# Patient Record
Sex: Male | Born: 1985
Health system: Southern US, Community
[De-identification: ages and names within clinical notes are randomized; demographics above are authoritative.]

## PROBLEM LIST (undated history)

## (undated) DIAGNOSIS — E559 Vitamin D deficiency, unspecified: Secondary | ICD-10-CM

## (undated) DIAGNOSIS — F1010 Alcohol abuse, uncomplicated: Secondary | ICD-10-CM

## (undated) DIAGNOSIS — E785 Hyperlipidemia, unspecified: Secondary | ICD-10-CM

## (undated) HISTORY — PX: APPENDECTOMY: SHX54

## (undated) HISTORY — DX: Vitamin D deficiency, unspecified: E55.9

## (undated) HISTORY — DX: Hyperlipidemia, unspecified: E78.5

## (undated) HISTORY — DX: Alcohol abuse, uncomplicated: F10.10

---

## 2003-04-02 ENCOUNTER — Emergency Department (HOSPITAL_COMMUNITY): Admission: EM | Admit: 2003-04-02 | Discharge: 2003-04-02 | Payer: Self-pay | Admitting: Obstetrics

## 2003-04-02 ENCOUNTER — Encounter: Payer: Self-pay | Admitting: *Deleted

## 2012-09-14 ENCOUNTER — Ambulatory Visit: Payer: BC Managed Care – PPO

## 2012-09-14 ENCOUNTER — Ambulatory Visit (INDEPENDENT_AMBULATORY_CARE_PROVIDER_SITE_OTHER): Payer: BC Managed Care – PPO | Admitting: Family Medicine

## 2012-09-14 VITALS — BP 118/74 | HR 77 | Temp 98.1°F | Resp 18 | Ht 69.0 in | Wt 193.0 lb

## 2012-09-14 DIAGNOSIS — M25469 Effusion, unspecified knee: Secondary | ICD-10-CM

## 2012-09-14 DIAGNOSIS — F1010 Alcohol abuse, uncomplicated: Secondary | ICD-10-CM | POA: Insufficient documentation

## 2012-09-14 DIAGNOSIS — M25569 Pain in unspecified knee: Secondary | ICD-10-CM

## 2012-09-14 DIAGNOSIS — T148XXA Other injury of unspecified body region, initial encounter: Secondary | ICD-10-CM

## 2012-09-14 DIAGNOSIS — M25561 Pain in right knee: Secondary | ICD-10-CM

## 2012-09-14 DIAGNOSIS — F1020 Alcohol dependence, uncomplicated: Secondary | ICD-10-CM | POA: Insufficient documentation

## 2012-09-14 HISTORY — DX: Alcohol abuse, uncomplicated: F10.10

## 2012-09-14 NOTE — Progress Notes (Signed)
 [facility]:  Office Visit  Chief Complaint:  Chief Complaint  Patient presents with  . Knee Injury    right, happened saturday night    HPI: Louis Martinez is a 26 y.o. male who complains of left knee injury with localized knee swelling and  instability  for the last 3 days after wrestling with his friend. His friend swept underneath his left knee with his leg and he fell. The knee went in one direction and his upper body went in another direction. The patient has had a prior stress fracture to that leg. He did not have pain immediately or swelling , he still does not have pain, he just has swelling and a strange sensation in his knee. He denies hearing a pop, click. He is a daily weed user and alcoholic drinker so this may be inhibiting his pain sensation. He is using a knee brace with minimal relief.  Past Medical History  Diagnosis Date  . Alcohol abuse 09/14/12    Patient does not desire to quit at this time   Past Surgical History  Procedure Date  . Appendectomy    History   Social History  . Marital Status: Single    Spouse Name: N/A    Number of Children: N/A  . Years of Education: N/A   Social History Main Topics  . Smoking status: Never Smoker   . Smokeless tobacco: None  . Alcohol Use: Yes  . Drug Use: Yes    Special: Marijuana  . Sexually Active: None   Other Topics Concern  . None   Social History Narrative  . None   Family History  Problem Relation Age of Onset  . Diabetes Mother   . Hypertension Mother    No Known Allergies Prior to Admission medications   Not on File     ROS: The patient denies fevers, chills, night sweats, unintentional weight loss, chest pain, palpitations, wheezing, dyspnea on exertion, nausea, vomiting, abdominal pain, dysuria, hematuria, melena, numbness,  or tingling.   All other systems have been reviewed and were otherwise negative with the exception of those mentioned in the HPI and as above.     PHYSICAL EXAM: Filed Vitals:   09/14/12 1338  BP: 118/74  Pulse: 77  Temp: 98.1 F (36.7 C)  Resp: 18   Filed Vitals:   09/14/12 1338  Height: 5\' 9"  (1.753 m)  Weight: 193 lb (87.544 kg)   Body mass index is 28.50 kg/(m^2).  General: Alert, no acute distress HEENT:  Normocephalic, atraumatic, oropharynx patent.  Cardiovascular:  Regular rate and rhythm, no rubs murmurs or gallops.  No Carotid bruits, radial pulse intact. No pedal edema.  Respiratory: Clear to auscultation bilaterally.  No wheezes, rales, or rhonchi.  No cyanosis, no use of accessory musculature GI: No organomegaly, abdomen is soft and non-tender, positive bowel sounds.  No masses. Skin: No rashes. Neurologic: Facial musculature symmetric. Psychiatric: Patient is appropriate throughout our interaction. Lymphatic: No cervical lymphadenopathy Musculoskeletal: Gait not intact Hips-nl Right quad-4/4 strength Right knee- + swelling lateral , neg crepitus, + medial jt linetenderness, stable to varus/valgus stress, ? + Lachman's test.    LABS: No results found for this or any previous visit.   EKG/XRAY:   Primary read interpreted by Dr. Conley Rolls at [facility]. Right knee-no fx/dislocation   ASSESSMENT/PLAN: Encounter Diagnoses  Name Primary?  . Knee pain, right Yes  . Knee swelling   . Sprain and strain    ? Simple  Sprain and strain vs ? ACL tear/meniscus injury Will immobilize knee Rx NSAIDs otc RICE Stay off work for one week Patient will call in 1 week to see if need referral to ortho    ,  PHUONG, DO 09/14/2012 2:45 PM

## 2012-10-02 ENCOUNTER — Telehealth: Payer: Self-pay

## 2012-10-02 NOTE — Telephone Encounter (Signed)
Asked XRay Dept to make a copy of xray.

## 2012-10-02 NOTE — Telephone Encounter (Signed)
Patient would like copy of x-rays to pick up.  Best [phone]

## 2012-10-02 NOTE — Telephone Encounter (Signed)
Notified pt that xray is ready for p/up

## 2014-01-23 DIAGNOSIS — E559 Vitamin D deficiency, unspecified: Secondary | ICD-10-CM | POA: Insufficient documentation

## 2014-01-24 ENCOUNTER — Ambulatory Visit (INDEPENDENT_AMBULATORY_CARE_PROVIDER_SITE_OTHER): Payer: BC Managed Care – PPO | Admitting: Emergency Medicine

## 2014-01-24 ENCOUNTER — Encounter: Payer: Self-pay | Admitting: Emergency Medicine

## 2014-01-24 VITALS — BP 122/80 | HR 68 | Temp 98.0°F | Resp 18 | Ht 69.0 in | Wt 202.0 lb

## 2014-01-24 DIAGNOSIS — Z111 Encounter for screening for respiratory tuberculosis: Secondary | ICD-10-CM

## 2014-01-24 DIAGNOSIS — Z0000 Encounter for general adult medical examination without abnormal findings: Secondary | ICD-10-CM

## 2014-01-24 DIAGNOSIS — Z79899 Other long term (current) drug therapy: Secondary | ICD-10-CM

## 2014-01-24 DIAGNOSIS — E559 Vitamin D deficiency, unspecified: Secondary | ICD-10-CM

## 2014-01-24 DIAGNOSIS — E785 Hyperlipidemia, unspecified: Secondary | ICD-10-CM

## 2014-01-24 DIAGNOSIS — E782 Mixed hyperlipidemia: Secondary | ICD-10-CM | POA: Insufficient documentation

## 2014-01-24 LAB — CBC WITH DIFFERENTIAL/PLATELET
Basophils Absolute: 0 10*3/uL (ref 0.0–0.1)
Basophils Relative: 0 % (ref 0–1)
EOS ABS: 0.1 10*3/uL (ref 0.0–0.7)
EOS PCT: 1 % (ref 0–5)
HEMATOCRIT: 46 % (ref 39.0–52.0)
HEMOGLOBIN: 16.4 g/dL (ref 13.0–17.0)
LYMPHS ABS: 1.5 10*3/uL (ref 0.7–4.0)
Lymphocytes Relative: 17 % (ref 12–46)
MCH: 33 pg (ref 26.0–34.0)
MCHC: 35.7 g/dL (ref 30.0–36.0)
MCV: 92.6 fL (ref 78.0–100.0)
MONOS PCT: 8 % (ref 3–12)
Monocytes Absolute: 0.7 10*3/uL (ref 0.1–1.0)
Neutro Abs: 6.4 10*3/uL (ref 1.7–7.7)
Neutrophils Relative %: 74 % (ref 43–77)
Platelets: 245 10*3/uL (ref 150–400)
RBC: 4.97 MIL/uL (ref 4.22–5.81)
RDW: 13.6 % (ref 11.5–15.5)
WBC: 8.7 10*3/uL (ref 4.0–10.5)

## 2014-01-24 LAB — HEMOGLOBIN A1C
Hgb A1c MFr Bld: 4.9 % (ref <5.7)
Mean Plasma Glucose: 94 mg/dL (ref <117)

## 2014-01-24 NOTE — Progress Notes (Signed)
   Subjective:    Patient ID: Louis Martinez, male    DOB: April 06, 1986, 28 y.o.   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 He is not exercising, but keeps busy. He eats descent. He is doing well overall and denies any concerns. LAST LABS BS 102 T 1 A1C 4.9 TG 72 H 49 L108 D 30 STD PANEL NEG 08/2012  He is up to date with immunizations except he did not receive 3rd gardasil but declines. He has updated eye appointment and wears corrective lenses.      Review of Systems  All other systems reviewed and are negative.   BP 122/80  Pulse 68  Temp(Src) 98 F (36.7 C) (Temporal)  Resp 18  Ht 5\' 9"  (1.753 m)  Wt 202 lb (91.627 kg)  BMI 29.82 kg/m2      Objective:   Physical Exam  Nursing note and vitals reviewed. Constitutional: He is oriented to person, place, and time. He appears well-developed and well-nourished.  HENT:  Head: Normocephalic and atraumatic.  Right Ear: External ear normal.  Left Ear: External ear normal.  Nose: Nose normal.  Eyes: Conjunctivae and EOM are normal. Pupils are equal, round, and reactive to light. Right eye exhibits no discharge. Left eye exhibits no discharge. No scleral icterus.  Neck: Normal range of motion. Neck supple. No JVD present. No tracheal deviation present. No thyromegaly present.  Cardiovascular: Normal rate, regular rhythm, normal heart sounds and intact distal pulses.   Pulmonary/Chest: Effort normal and breath sounds normal.  Abdominal: Soft. Bowel sounds are normal. He exhibits no distension and no mass. There is no tenderness. There is no rebound and no guarding.  Genitourinary:  Def til 2016   Musculoskeletal: Normal range of motion. He exhibits no edema and no tenderness.  Lymphadenopathy:    He has no cervical adenopathy.  Neurological: He is alert and oriented to person, place, and time. He has normal reflexes. No cranial nerve deficit. He exhibits normal muscle tone. Coordination normal.  Skin:  Skin is warm and dry. No rash noted. No erythema. No pallor.  Right posterior shoulder 3 mm mild dark brown irreg flat nevi  Psychiatric: He has a normal mood and affect. His behavior is normal. Judgment and thought content normal.     EKG NSCSPT WNL     Assessment & Plan:  1. CPE- Update screening labs/ History/ Immunizations/ Testing as needed. Advised healthy diet, QD exercise, increase H20 and continue RX/ Vitamins AD. 2. Irreg Nevi- monitor for any change, call if occurs for removal 3. Cholesterol- recheck labs, Need to eat healthier and exercise AD.

## 2014-01-24 NOTE — Patient Instructions (Signed)
Fat and Cholesterol Control Diet  Fat and cholesterol levels in your blood and organs are influenced by your diet. High levels of fat and cholesterol may lead to diseases of the heart, small and large blood vessels, gallbladder, liver, and pancreas.  CONTROLLING FAT AND CHOLESTEROL WITH DIET  Although exercise and lifestyle factors are important, your diet is key. That is because certain foods are known to raise cholesterol and others to lower it. The goal is to balance foods for their effect on cholesterol and more importantly, to replace saturated and trans fat with other types of fat, such as monounsaturated fat, polyunsaturated fat, and omega-3 fatty acids.  On average, a person should consume no more than 15 to 17 g of saturated fat daily. Saturated and trans fats are considered "bad" fats, and they will raise LDL cholesterol. Saturated fats are primarily found in animal products such as meats, butter, and cream. However, that does not mean you need to give up all your favorite foods. Today, there are good tasting, low-fat, low-cholesterol substitutes for most of the things you like to eat. Choose low-fat or nonfat alternatives. Choose round or loin cuts of red meat. These types of cuts are lowest in fat and cholesterol. Chicken (without the skin), fish, veal, and ground turkey breast are great choices. Eliminate fatty meats, such as hot dogs and salami. Even shellfish have little or no saturated fat. Have a 3 oz (85 g) portion when you eat lean meat, poultry, or fish.  Trans fats are also called "partially hydrogenated oils." They are oils that have been scientifically manipulated so that they are solid at room temperature resulting in a longer shelf life and improved taste and texture of foods in which they are added. Trans fats are found in stick margarine, some tub margarines, cookies, crackers, and baked goods.   When baking and cooking, oils are a great substitute for butter. The monounsaturated oils are  especially beneficial since it is believed they lower LDL and raise HDL. The oils you should avoid entirely are saturated tropical oils, such as coconut and palm.   Remember to eat a lot from food groups that are naturally free of saturated and trans fat, including fish, fruit, vegetables, beans, grains (barley, rice, couscous, bulgur wheat), and pasta (without cream sauces).   IDENTIFYING FOODS THAT LOWER FAT AND CHOLESTEROL   Soluble fiber may lower your cholesterol. This type of fiber is found in fruits such as apples, vegetables such as broccoli, potatoes, and carrots, legumes such as beans, peas, and lentils, and grains such as barley. Foods fortified with plant sterols (phytosterol) may also lower cholesterol. You should eat at least 2 g per day of these foods for a cholesterol lowering effect.   Read package labels to identify low-saturated fats, trans fat free, and low-fat foods at the supermarket. Select cheeses that have only 2 to 3 g saturated fat per ounce. Use a heart-healthy tub margarine that is free of trans fats or partially hydrogenated oil. When buying baked goods (cookies, crackers), avoid partially hydrogenated oils. Breads and muffins should be made from whole grains (whole-wheat or whole oat flour, instead of "flour" or "enriched flour"). Buy non-creamy canned soups with reduced salt and no added fats.   FOOD PREPARATION TECHNIQUES   Never deep-fry. If you must fry, either stir-fry, which uses very little fat, or use non-stick cooking sprays. When possible, broil, bake, or roast meats, and steam vegetables. Instead of putting butter or margarine on vegetables, use lemon   and herbs, applesauce, and cinnamon (for squash and sweet potatoes). Use nonfat yogurt, salsa, and low-fat dressings for salads.   LOW-SATURATED FAT / LOW-FAT FOOD SUBSTITUTES  Meats / Saturated Fat (g)  · Avoid: Steak, marbled (3 oz/85 g) / 11 g  · Choose: Steak, lean (3 oz/85 g) / 4 g  · Avoid: Hamburger (3 oz/85 g) / 7  g  · Choose: Hamburger, lean (3 oz/85 g) / 5 g  · Avoid: Ham (3 oz/85 g) / 6 g  · Choose: Ham, lean cut (3 oz/85 g) / 2.4 g  · Avoid: Chicken, with skin, dark meat (3 oz/85 g) / 4 g  · Choose: Chicken, skin removed, dark meat (3 oz/85 g) / 2 g  · Avoid: Chicken, with skin, light meat (3 oz/85 g) / 2.5 g  · Choose: Chicken, skin removed, light meat (3 oz/85 g) / 1 g  Dairy / Saturated Fat (g)  · Avoid: Whole milk (1 cup) / 5 g  · Choose: Low-fat milk, 2% (1 cup) / 3 g  · Choose: Low-fat milk, 1% (1 cup) / 1.5 g  · Choose: Skim milk (1 cup) / 0.3 g  · Avoid: Hard cheese (1 oz/28 g) / 6 g  · Choose: Skim milk cheese (1 oz/28 g) / 2 to 3 g  · Avoid: Cottage cheese, 4% fat (1 cup) / 6.5 g  · Choose: Low-fat cottage cheese, 1% fat (1 cup) / 1.5 g  · Avoid: Ice cream (1 cup) / 9 g  · Choose: Sherbet (1 cup) / 2.5 g  · Choose: Nonfat frozen yogurt (1 cup) / 0.3 g  · Choose: Frozen fruit bar / trace  · Avoid: Whipped cream (1 tbs) / 3.5 g  · Choose: Nondairy whipped topping (1 tbs) / 1 g  Condiments / Saturated Fat (g)  · Avoid: Mayonnaise (1 tbs) / 2 g  · Choose: Low-fat mayonnaise (1 tbs) / 1 g  · Avoid: Butter (1 tbs) / 7 g  · Choose: Extra light margarine (1 tbs) / 1 g  · Avoid: Coconut oil (1 tbs) / 11.8 g  · Choose: Olive oil (1 tbs) / 1.8 g  · Choose: Corn oil (1 tbs) / 1.7 g  · Choose: Safflower oil (1 tbs) / 1.2 g  · Choose: Sunflower oil (1 tbs) / 1.4 g  · Choose: Soybean oil (1 tbs) / 2.4 g  · Choose: Canola oil (1 tbs) / 1 g  Document Released: 11/25/2005 Document Revised: 03/22/2013 Document Reviewed: 05/16/2011  ExitCare® Patient Information ©2014 [employer].

## 2014-01-25 LAB — BASIC METABOLIC PANEL WITH GFR
BUN: 9 mg/dL (ref 6–23)
CALCIUM: 9.4 mg/dL (ref 8.4–10.5)
CO2: 27 meq/L (ref 19–32)
CREATININE: 0.77 mg/dL (ref 0.50–1.35)
Chloride: 102 mEq/L (ref 96–112)
GFR, Est African American: >89 mL/min
GFR, Est Non African American: >89 mL/min
GLUCOSE: 95 mg/dL (ref 70–99)
Potassium: 4.1 mEq/L (ref 3.5–5.3)
Sodium: 143 mEq/L (ref 135–145)

## 2014-01-25 LAB — MICROALBUMIN / CREATININE URINE RATIO
Creatinine, Urine: 407 mg/dL
Microalb Creat Ratio: 3.2 mg/g (ref 0.0–30.0)
Microalb, Ur: 1.3 mg/dL (ref 0.00–1.89)

## 2014-01-25 LAB — URINALYSIS, ROUTINE W REFLEX MICROSCOPIC
GLUCOSE, UA: NEGATIVE mg/dL
HGB URINE DIPSTICK: NEGATIVE
KETONES UR: NEGATIVE mg/dL
Leukocytes, UA: NEGATIVE
Nitrite: NEGATIVE
PROTEIN: NEGATIVE mg/dL
Specific Gravity, Urine: 1.03 (ref 1.005–1.030)
Urobilinogen, UA: 0.2 mg/dL (ref 0.0–1.0)
pH: 6 (ref 5.0–8.0)

## 2014-01-25 LAB — LIPID PANEL
Cholesterol: 181 mg/dL (ref 0–200)
HDL: 43 mg/dL (ref >39)
LDL CALC: 108 mg/dL — AB (ref 0–99)
TRIGLYCERIDES: 150 mg/dL — AB (ref <150)
Total CHOL/HDL Ratio: 4.2 Ratio
VLDL: 30 mg/dL (ref 0–40)

## 2014-01-25 LAB — TSH: TSH: 1.092 u[IU]/mL (ref 0.350–4.500)

## 2014-01-25 LAB — HEPATIC FUNCTION PANEL
ALBUMIN: 4.6 g/dL (ref 3.5–5.2)
ALT: 25 U/L (ref 0–53)
AST: 18 U/L (ref 0–37)
Alkaline Phosphatase: 82 U/L (ref 39–117)
BILIRUBIN INDIRECT: 1.1 mg/dL (ref 0.2–1.2)
Bilirubin, Direct: 0.2 mg/dL (ref 0.0–0.3)
TOTAL PROTEIN: 6.8 g/dL (ref 6.0–8.3)
Total Bilirubin: 1.3 mg/dL — ABNORMAL HIGH (ref 0.2–1.2)

## 2014-01-25 LAB — MAGNESIUM: Magnesium: 1.9 mg/dL (ref 1.5–2.5)

## 2014-01-25 LAB — VITAMIN D 25 HYDROXY (VIT D DEFICIENCY, FRACTURES): Vit D, 25-Hydroxy: 21 ng/mL — ABNORMAL LOW (ref 30–89)

## 2014-01-25 LAB — INSULIN, FASTING: Insulin fasting, serum: 12 u[IU]/mL (ref 3–28)

## 2014-01-25 LAB — TESTOSTERONE: TESTOSTERONE: 481 ng/dL (ref 300–890)

## 2014-01-27 LAB — TB SKIN TEST
Induration: 0 mm
TB Skin Test: NEGATIVE

## 2014-02-04 NOTE — Progress Notes (Signed)
LMOM

## 2014-05-10 ENCOUNTER — Ambulatory Visit: Payer: Self-pay | Admitting: Emergency Medicine

## 2015-01-24 ENCOUNTER — Encounter: Payer: Self-pay | Admitting: Emergency Medicine

## 2015-03-08 ENCOUNTER — Ambulatory Visit (INDEPENDENT_AMBULATORY_CARE_PROVIDER_SITE_OTHER): Payer: BLUE CROSS/BLUE SHIELD | Admitting: Emergency Medicine

## 2015-03-08 ENCOUNTER — Encounter: Payer: Self-pay | Admitting: Emergency Medicine

## 2015-03-08 VITALS — BP 106/58 | HR 64 | Temp 98.4°F | Resp 18 | Ht 69.0 in

## 2015-03-08 DIAGNOSIS — R197 Diarrhea, unspecified: Secondary | ICD-10-CM

## 2015-03-08 DIAGNOSIS — R6889 Other general symptoms and signs: Secondary | ICD-10-CM

## 2015-03-08 DIAGNOSIS — E559 Vitamin D deficiency, unspecified: Secondary | ICD-10-CM

## 2015-03-08 DIAGNOSIS — I10 Essential (primary) hypertension: Secondary | ICD-10-CM

## 2015-03-08 DIAGNOSIS — Z1212 Encounter for screening for malignant neoplasm of rectum: Secondary | ICD-10-CM

## 2015-03-08 DIAGNOSIS — Z79899 Other long term (current) drug therapy: Secondary | ICD-10-CM

## 2015-03-08 DIAGNOSIS — Z111 Encounter for screening for respiratory tuberculosis: Secondary | ICD-10-CM

## 2015-03-08 DIAGNOSIS — Z0001 Encounter for general adult medical examination with abnormal findings: Secondary | ICD-10-CM

## 2015-03-08 DIAGNOSIS — Z113 Encounter for screening for infections with a predominantly sexual mode of transmission: Secondary | ICD-10-CM

## 2015-03-08 DIAGNOSIS — F411 Generalized anxiety disorder: Secondary | ICD-10-CM

## 2015-03-08 DIAGNOSIS — Z0000 Encounter for general adult medical examination without abnormal findings: Secondary | ICD-10-CM

## 2015-03-08 LAB — CBC WITH DIFFERENTIAL/PLATELET
Basophils Absolute: 0.1 10*3/uL (ref 0.0–0.1)
Basophils Relative: 1 % (ref 0–1)
EOS PCT: 2 % (ref 0–5)
Eosinophils Absolute: 0.1 10*3/uL (ref 0.0–0.7)
HCT: 42.8 % (ref 39.0–52.0)
Hemoglobin: 14.9 g/dL (ref 13.0–17.0)
LYMPHS PCT: 20 % (ref 12–46)
Lymphs Abs: 1.3 10*3/uL (ref 0.7–4.0)
MCH: 32.9 pg (ref 26.0–34.0)
MCHC: 34.8 g/dL (ref 30.0–36.0)
MCV: 94.5 fL (ref 78.0–100.0)
MONO ABS: 0.7 10*3/uL (ref 0.1–1.0)
MPV: 10.1 fL (ref 8.6–12.4)
Monocytes Relative: 11 % (ref 3–12)
Neutro Abs: 4.2 10*3/uL (ref 1.7–7.7)
Neutrophils Relative %: 66 % (ref 43–77)
PLATELETS: 192 10*3/uL (ref 150–400)
RBC: 4.53 MIL/uL (ref 4.22–5.81)
RDW: 13.4 % (ref 11.5–15.5)
WBC: 6.4 10*3/uL (ref 4.0–10.5)

## 2015-03-08 LAB — HEMOGLOBIN A1C
HEMOGLOBIN A1C: 4.9 % (ref <5.7)
MEAN PLASMA GLUCOSE: 94 mg/dL (ref <117)

## 2015-03-08 NOTE — Progress Notes (Signed)
Subjective:    Patient ID: Louis Martinez, male    DOB: [birth year], 29 y.o.   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 He has mildly elevated cholesterol.  He is not exercising routinely or eating healthy.   He notes increased anxiety over the last several months. He notes sleep is good with set schedule. He notes bowel movements are on/off with diarrhea.  Diarrhea worse with diet of increased meat and when anxiety is increased.  He needs STD screening, denies known exposure or symptoms.  Lab Results      Component                Value               Date                      WBC                      8.7                 01/24/2014                HGB                      16.4                01/24/2014                HCT                      46.0                01/24/2014                PLT                      245                 01/24/2014                GLUCOSE                  95                  01/24/2014                CHOL                     181                 01/24/2014                TRIG                     150*                01/24/2014                HDL                      43                  01/24/2014                LDLCALC  108*                01/24/2014                ALT                      25                  01/24/2014                AST                      18                  01/24/2014                NA                       143                 01/24/2014                K                        4.1                 01/24/2014                CL                       102                 01/24/2014                CREATININE               0.77                01/24/2014                BUN                      9                   01/24/2014                CO2                      27                  01/24/2014                TSH                      1.092               01/24/2014                HGBA1C                   4.9                  01/24/2014                MICROALBUR               1.30  01/24/2014                Medication List    Notice  As of 03/08/2015  9:24 AM   You have not been prescribed any medications.     No Known Allergies  Past Medical History  Diagnosis Date  . Alcohol abuse 09/14/12    Patient does not desire to quit at this time  . Vitamin D deficiency   . Hyperlipidemia    Past Surgical History  Procedure Laterality Date  . Appendectomy      29 year old   History  Substance Use Topics  . Smoking status: Current Some Day Smoker  . Smokeless tobacco: Not on file  . Alcohol Use: 0.0 oz/week    0 Standard drinks or equivalent per week   Family History  Problem Relation Age of Onset  . Diabetes Mother   . Hypertension Mother   . Alcohol abuse Father   . Cancer Maternal Grandmother     BREAST  . Stroke Maternal Grandfather   . Cancer Paternal Grandmother     lung   MAINTENANCE: EYE: contacts 2016/ WNL Dentist:03/07/15 WNL Q 6 months  IMMUNIZATIONS: Tdap:2010 Pneumovax:n/a Zostavax:n/a Influenza: declines  Patient Care Team: Lucky Cowboy, MD as PCP - General (Internal Medicine)  Webb Silversmith, (Dentist) Lorin Picket, (EYE)  Review of Systems  Constitutional: Negative for fatigue.  Respiratory: Negative for shortness of breath.   Cardiovascular: Negative for chest pain.  Gastrointestinal: Positive for diarrhea.  Psychiatric/Behavioral: Negative for suicidal ideas and agitation. The patient is nervous/anxious.   All other systems reviewed and are negative.  BP 106/58 mmHg  Pulse 64  Temp(Src) 98.4 F (36.9 C) (Temporal)  Resp 18  Ht 5\' 9"  (1.753 m)  PF 171 L/min      Objective:   Physical Exam  Constitutional: He is oriented to person, place, and time. He appears well-developed and well-nourished.  HENT:  Head: Normocephalic.  Nose: Nose normal.  Mouth/Throat: Oropharynx is clear and moist.  Eyes: Conjunctivae and EOM are normal. Pupils are equal, round,  and reactive to light. Right eye exhibits no discharge. Left eye exhibits no discharge. No scleral icterus.  Neck: Normal range of motion. Neck supple. No JVD present. No tracheal deviation present. No thyromegaly present.  Cardiovascular: Normal rate, regular rhythm, normal heart sounds and intact distal pulses.   Pulmonary/Chest: Effort normal and breath sounds normal.  Abdominal: Soft. Bowel sounds are normal. He exhibits no distension and no mass. There is no tenderness. There is no rebound and no guarding.  Musculoskeletal: Normal range of motion. He exhibits no edema or tenderness.  Lymphadenopathy:    He has no cervical adenopathy.  Neurological: He is alert and oriented to person, place, and time. He has normal reflexes. No cranial nerve deficit. He exhibits normal muscle tone. Coordination normal.  Skin: Skin is warm and dry. No rash noted. No erythema.  Psychiatric: He has a normal mood and affect. His behavior is normal. Judgment and thought content normal.  Nursing note and vitals reviewed.     EKG NSCSPT WNL     Assessment & Plan:  1. CPE- Update screening labs/ History/ Immunizations/ Testing as needed. Advised healthy diet, QD exercise, increase H20 and continue RX/ Vitamins AD.  2. Anxiety vs mood disorder with + hx of alcohol and marijuana use- Check labs if WNL consider medication. Increase exercise routine, improve sleep hygiene and diet. Sleep hygiene discussed. w/c if SX increase or ER.   3. Diarrhea- ?  Anxiety vs diet related. Check labs/ stool cards. Improve diet and decrease stress. Add probiotic. May need GI referral if no improvement  4. Cholesterol- recheck labs, Need to eat healthier and exercise AD.

## 2015-03-08 NOTE — Patient Instructions (Signed)
Generalized Anxiety Disorder Generalized anxiety disorder (GAD) is a mental disorder. It interferes with life functions, including relationships, work, and school. GAD is different from normal anxiety, which everyone experiences at some point in their lives in response to specific life events and activities. Normal anxiety actually helps us prepare for and get through these life events and activities. Normal anxiety goes away after the event or activity is over.  GAD causes anxiety that is not necessarily related to specific events or activities. It also causes excess anxiety in proportion to specific events or activities. The anxiety associated with GAD is also difficult to control. GAD can vary from mild to severe. People with severe GAD can have intense waves of anxiety with physical symptoms (panic attacks).  SYMPTOMS The anxiety and worry associated with GAD are difficult to control. This anxiety and worry are related to many life events and activities and also occur more days than not for 6 months or longer. People with GAD also have three or more of the following symptoms (one or more in children):  Restlessness.   Fatigue.  Difficulty concentrating.   Irritability.  Muscle tension.  Difficulty sleeping or unsatisfying sleep. DIAGNOSIS GAD is diagnosed through an assessment by your health care provider. Your health care provider will ask you questions aboutyour mood,physical symptoms, and events in your life. Your health care provider may ask you about your medical history and use of alcohol or drugs, including prescription medicines. Your health care provider may also do a physical exam and blood tests. Certain medical conditions and the use of certain substances can cause symptoms similar to those associated with GAD. Your health care provider may refer you to a mental health specialist for further evaluation. TREATMENT The following therapies are usually used to treat GAD:    Medication. Antidepressant medication usually is prescribed for long-term daily control. Antianxiety medicines may be added in severe cases, especially when panic attacks occur.   Talk therapy (psychotherapy). Certain types of talk therapy can be helpful in treating GAD by providing support, education, and guidance. A form of talk therapy called cognitive behavioral therapy can teach you healthy ways to think about and react to daily life events and activities.  Stress managementtechniques. These include yoga, meditation, and exercise and can be very helpful when they are practiced regularly. A mental health specialist can help determine which treatment is best for you. Some people see improvement with one therapy. However, other people require a combination of therapies. Document Released: 03/22/2013 Document Revised: 04/11/2014 Document Reviewed: 03/22/2013 ExitCare Patient Information 2015 [employer]. This information is not intended to replace advice given to you by your health care provider. Make sure you discuss any questions you have with your health care provider.  

## 2015-03-09 LAB — VITAMIN D 25 HYDROXY (VIT D DEFICIENCY, FRACTURES): Vit D, 25-Hydroxy: 20 ng/mL — ABNORMAL LOW (ref 30–100)

## 2015-03-09 LAB — HEPATIC FUNCTION PANEL
ALT: 20 U/L (ref 0–53)
AST: 18 U/L (ref 0–37)
Albumin: 4.3 g/dL (ref 3.5–5.2)
Alkaline Phosphatase: 66 U/L (ref 39–117)
BILIRUBIN TOTAL: 1 mg/dL (ref 0.2–1.2)
Bilirubin, Direct: 0.2 mg/dL (ref 0.0–0.3)
Indirect Bilirubin: 0.8 mg/dL (ref 0.2–1.2)
TOTAL PROTEIN: 6.5 g/dL (ref 6.0–8.3)

## 2015-03-09 LAB — BASIC METABOLIC PANEL WITH GFR
BUN: 9 mg/dL (ref 6–23)
CO2: 24 mEq/L (ref 19–32)
Calcium: 9.1 mg/dL (ref 8.4–10.5)
Chloride: 104 mEq/L (ref 96–112)
Creat: 0.73 mg/dL (ref 0.50–1.35)
GFR, Est African American: >89 mL/min
GFR, Est Non African American: >89 mL/min
Glucose, Bld: 83 mg/dL (ref 70–99)
Potassium: 4.2 mEq/L (ref 3.5–5.3)
Sodium: 142 mEq/L (ref 135–145)

## 2015-03-09 LAB — URINALYSIS, ROUTINE W REFLEX MICROSCOPIC
Bilirubin Urine: NEGATIVE
GLUCOSE, UA: NEGATIVE mg/dL
HGB URINE DIPSTICK: NEGATIVE
Ketones, ur: NEGATIVE mg/dL
Leukocytes, UA: NEGATIVE
NITRITE: NEGATIVE
PH: 6.5 (ref 5.0–8.0)
Protein, ur: NEGATIVE mg/dL
Specific Gravity, Urine: 1.015 (ref 1.005–1.030)
UROBILINOGEN UA: 1 mg/dL (ref 0.0–1.0)

## 2015-03-09 LAB — LIPID PANEL
CHOL/HDL RATIO: 2.8 ratio
Cholesterol: 143 mg/dL (ref 0–200)
HDL: 52 mg/dL (ref >=40)
LDL Cholesterol: 82 mg/dL (ref 0–99)
Triglycerides: 46 mg/dL (ref <150)
VLDL: 9 mg/dL (ref 0–40)

## 2015-03-09 LAB — HEPATITIS PANEL, ACUTE
HCV AB: NEGATIVE
HEP A IGM: NONREACTIVE
Hep B C IgM: NONREACTIVE
Hepatitis B Surface Ag: NEGATIVE

## 2015-03-09 LAB — TSH: TSH: 0.985 u[IU]/mL (ref 0.350–4.500)

## 2015-03-09 LAB — INSULIN, FASTING: Insulin fasting, serum: 2.8 u[IU]/mL (ref 2.0–19.6)

## 2015-03-09 LAB — MAGNESIUM: Magnesium: 1.9 mg/dL (ref 1.5–2.5)

## 2015-03-09 LAB — HSV(HERPES SIMPLEX VRS) I + II AB-IGG
HSV 1 GLYCOPROTEIN G AB, IGG: 0.16 IV
HSV 2 Glycoprotein G Ab, IgG: <0.1 IV

## 2015-03-09 LAB — VITAMIN B12: Vitamin B-12: 429 pg/mL (ref 211–911)

## 2015-03-09 LAB — TESTOSTERONE: Testosterone: 545 ng/dL (ref 300–890)

## 2015-03-09 LAB — HIV ANTIBODY (ROUTINE TESTING W REFLEX): HIV 1&2 Ab, 4th Generation: NONREACTIVE

## 2015-03-09 LAB — RPR

## 2015-03-09 LAB — GC PROBE AMPLIFICATION, URINE: GC Probe Amp, Urine: NEGATIVE

## 2015-03-10 LAB — TB SKIN TEST
Induration: 0 mm
TB SKIN TEST: NEGATIVE

## 2015-03-12 ENCOUNTER — Other Ambulatory Visit: Payer: Self-pay | Admitting: Emergency Medicine

## 2015-03-12 MED ORDER — SERTRALINE HCL 50 MG PO TABS: 50.0000 mg | ORAL_TABLET | Freq: Every day | ORAL | Status: DC

## 2015-03-13 ENCOUNTER — Encounter: Payer: Self-pay | Admitting: *Deleted

## 2015-04-07 ENCOUNTER — Ambulatory Visit (INDEPENDENT_AMBULATORY_CARE_PROVIDER_SITE_OTHER): Payer: BLUE CROSS/BLUE SHIELD | Admitting: Internal Medicine

## 2015-04-07 ENCOUNTER — Encounter: Payer: Self-pay | Admitting: Internal Medicine

## 2015-04-07 VITALS — BP 110/66 | HR 74 | Temp 98.2°F | Resp 16 | Ht 69.0 in | Wt 173.0 lb

## 2015-04-07 DIAGNOSIS — F411 Generalized anxiety disorder: Secondary | ICD-10-CM

## 2015-04-07 MED ORDER — SERTRALINE HCL 50 MG PO TABS: 50.0000 mg | ORAL_TABLET | Freq: Every day | ORAL | Status: DC

## 2015-04-07 NOTE — Patient Instructions (Signed)
Generalized Anxiety Disorder Generalized anxiety disorder (GAD) is a mental disorder. It interferes with life functions, including relationships, work, and school. GAD is different from normal anxiety, which everyone experiences at some point in their lives in response to specific life events and activities. Normal anxiety actually helps us prepare for and get through these life events and activities. Normal anxiety goes away after the event or activity is over.  GAD causes anxiety that is not necessarily related to specific events or activities. It also causes excess anxiety in proportion to specific events or activities. The anxiety associated with GAD is also difficult to control. GAD can vary from mild to severe. People with severe GAD can have intense waves of anxiety with physical symptoms (panic attacks).  SYMPTOMS The anxiety and worry associated with GAD are difficult to control. This anxiety and worry are related to many life events and activities and also occur more days than not for 6 months or longer. People with GAD also have three or more of the following symptoms (one or more in children):  Restlessness.   Fatigue.  Difficulty concentrating.   Irritability.  Muscle tension.  Difficulty sleeping or unsatisfying sleep. DIAGNOSIS GAD is diagnosed through an assessment by your health care provider. Your health care provider will ask you questions aboutyour mood,physical symptoms, and events in your life. Your health care provider may ask you about your medical history and use of alcohol or drugs, including prescription medicines. Your health care provider may also do a physical exam and blood tests. Certain medical conditions and the use of certain substances can cause symptoms similar to those associated with GAD. Your health care provider may refer you to a mental health specialist for further evaluation. TREATMENT The following therapies are usually used to treat GAD:    Medication. Antidepressant medication usually is prescribed for long-term daily control. Antianxiety medicines may be added in severe cases, especially when panic attacks occur.   Talk therapy (psychotherapy). Certain types of talk therapy can be helpful in treating GAD by providing support, education, and guidance. A form of talk therapy called cognitive behavioral therapy can teach you healthy ways to think about and react to daily life events and activities.  Stress managementtechniques. These include yoga, meditation, and exercise and can be very helpful when they are practiced regularly. A mental health specialist can help determine which treatment is best for you. Some people see improvement with one therapy. However, other people require a combination of therapies. Document Released: 03/22/2013 Document Revised: 04/11/2014 Document Reviewed: 03/22/2013 ExitCare Patient Information 2015 [employer]. This information is not intended to replace advice given to you by your health care provider. Make sure you discuss any questions you have with your health care provider.  

## 2015-04-07 NOTE — Progress Notes (Signed)
   Subjective:    Patient ID: Louis Martinez, male    DOB: Jan 04, 1986, 29 y.o.   MRN: [id number]  HPI  Patient is a 29 y.o. Male with anxiety who presents to the office for evaluation of anxiety.  He notes that he has always been stressed but has noticed it more lately.  He does have more severe diarrhea when he is anxious, although the diarrhea has been a chronic problem and has improved with diet changes.  He has never been on medication.  He notes that he has panic attacks very rarely.  No change in appetite or weight loss without trying.  He reports that his sleep is decent.    Review of Systems  Constitutional: Negative for fever, chills and fatigue.  Respiratory: Negative for chest tightness and shortness of breath.   Cardiovascular: Negative for chest pain and palpitations.  Neurological: Negative for dizziness and light-headedness.  Psychiatric/Behavioral: Negative for suicidal ideas, confusion and dysphoric mood. The patient is nervous/anxious.        Objective:   Physical Exam  Constitutional: He is oriented to person, place, and time. He appears well-developed and well-nourished. No distress.  HENT:  Head: Normocephalic and atraumatic.  Mouth/Throat: Oropharynx is clear and moist. No oropharyngeal exudate.  Eyes: Conjunctivae and EOM are normal. Pupils are equal, round, and reactive to light. No scleral icterus.  Neck: Normal range of motion. Neck supple. No JVD present. No thyromegaly present.  Cardiovascular: Normal rate, regular rhythm, normal heart sounds and intact distal pulses.  Exam reveals no gallop and no friction rub.   No murmur heard. Pulmonary/Chest: Effort normal and breath sounds normal. No respiratory distress. He has no wheezes. He has no rales. He exhibits no tenderness.  Musculoskeletal: Normal range of motion.  Lymphadenopathy:    He has no cervical adenopathy.  Neurological: He is alert and oriented to person, place, and time.  Skin: Skin is warm and  dry. He is not diaphoretic.  Psychiatric: He has a normal mood and affect. His behavior is normal. Judgment and thought content normal.  Nursing note and vitals reviewed.         Assessment & Plan:    1. Generalized anxiety disorder -taper up to 50 mg dose over 1-2 weeks.  Patient to call or go to the ER for any thoughts of harming himself or others or intolerable side effects. -recheck in 1 month.  - sertraline (ZOLOFT) 50 MG tablet; Take 1 tablet (50 mg total) by mouth daily.  Dispense: 30 tablet; Refill: 2

## 2015-05-05 ENCOUNTER — Ambulatory Visit: Payer: Self-pay | Admitting: Internal Medicine

## 2015-05-19 ENCOUNTER — Ambulatory Visit (INDEPENDENT_AMBULATORY_CARE_PROVIDER_SITE_OTHER): Payer: BLUE CROSS/BLUE SHIELD | Admitting: Internal Medicine

## 2015-05-19 ENCOUNTER — Encounter: Payer: Self-pay | Admitting: Internal Medicine

## 2015-05-19 VITALS — BP 110/56 | HR 98 | Temp 98.2°F | Resp 16

## 2015-05-19 DIAGNOSIS — Z113 Encounter for screening for infections with a predominantly sexual mode of transmission: Secondary | ICD-10-CM

## 2015-05-19 NOTE — Patient Instructions (Signed)

## 2015-05-19 NOTE — Progress Notes (Signed)
   Subjective:    Patient ID: Louis Martinez, male    DOB: July 10, 1986, 29 y.o.   MRN: [id number]  HPI  Patient reports to the office for STD screening.  He reports that he has been tested in the past and had everything come back okay. He reports that he did have unprotected sex several months ago.  To his knowledge the partner had no STDs that he is aware of.    He also presents for anxiety.  He reports that he never started the zoloft and has had a large improvement in his anxiety.  He has been on a much more structured schedule which is helping.    Review of Systems  Constitutional: Negative for fever, chills and fatigue.  Genitourinary: Negative for dysuria, urgency, frequency, hematuria, discharge, penile swelling and testicular pain.       Objective:   Physical Exam  Constitutional: He is oriented to person, place, and time. He appears well-developed and well-nourished. No distress.  HENT:  Head: Normocephalic.  Mouth/Throat: Oropharynx is clear and moist. No oropharyngeal exudate.  Eyes: Conjunctivae are normal. No scleral icterus.  Neck: Normal range of motion. Neck supple. No JVD present. No thyromegaly present.  Cardiovascular: Normal rate, regular rhythm, normal heart sounds and intact distal pulses.  Exam reveals no gallop and no friction rub.   No murmur heard. Pulmonary/Chest: Effort normal and breath sounds normal.  Abdominal: Soft. Bowel sounds are normal.  Genitourinary:  Patient deferred examination and wanted lab testing only.  Musculoskeletal: Normal range of motion.  Lymphadenopathy:    He has no cervical adenopathy.  Neurological: He is alert and oriented to person, place, and time.  Skin: Skin is warm and dry. He is not diaphoretic.  Psychiatric: He has a normal mood and affect. His behavior is normal. Judgment and thought content normal.  Nursing note and vitals reviewed.         Assessment & Plan:    1. Screening for STD (sexually transmitted  disease) -counseled on dangers of drugs and also unprotected sex.   - GC/chlamydia probe amp, urine - HIV antibody - RPR

## 2015-05-20 LAB — GC/CHLAMYDIA PROBE AMP, URINE
Chlamydia, Swab/Urine, PCR: NEGATIVE
GC PROBE AMP, URINE: NEGATIVE

## 2015-05-20 LAB — HIV ANTIBODY (ROUTINE TESTING W REFLEX): HIV: NONREACTIVE

## 2015-05-20 LAB — RPR

## 2015-07-26 ENCOUNTER — Encounter: Payer: Self-pay | Admitting: Internal Medicine

## 2015-07-26 ENCOUNTER — Ambulatory Visit (INDEPENDENT_AMBULATORY_CARE_PROVIDER_SITE_OTHER): Payer: BLUE CROSS/BLUE SHIELD | Admitting: Internal Medicine

## 2015-07-26 VITALS — BP 126/64 | HR 88 | Temp 98.6°F | Resp 16 | Ht 69.0 in | Wt 174.0 lb

## 2015-07-26 DIAGNOSIS — Z113 Encounter for screening for infections with a predominantly sexual mode of transmission: Secondary | ICD-10-CM | POA: Diagnosis not present

## 2015-07-26 NOTE — Progress Notes (Signed)
Patient ID: DAGAN HEINZ, male   DOB: [birth year], 29 y.o.   MRN: [id number]  Assessment and Plan:   1. Screening for STD (sexually transmitted disease)  - HIV antibody - RPR - GC/chlamydia probe amp, urine - HSV(herpes simplex vrs) 1+2 ab-IgG     HPI 29 y.o.male presents for 1 month follow up of previous STD testing.  He had a questionable one night stand about 3 months ago and he would like to make sure that we didn't miss anything. Patient reports that they have been doing well.  He is currently asymptomatic.  He reports that he just wants to be on the safe side.  He has had sexual encounters since then but he has been using condoms.  No fevers, chills, nausea, vomiting, abdominal pain, genital rash or sores, testicular pain or swelling, penile discharge.    Past Medical History  Diagnosis Date  . Alcohol abuse 09/14/12    Patient does not desire to quit at this time  . Vitamin D deficiency   . Hyperlipidemia      No Known Allergies    No current outpatient prescriptions on file prior to visit.   No current facility-administered medications on file prior to visit.    ROS: all negative except above.   Physical Exam: Filed Weights   07/26/15 1524  Weight: 174 lb (78.926 kg)   BP 126/64 mmHg  Pulse 88  Temp(Src) 98.6 F (37 C) (Temporal)  Resp 16  Ht  (1.753 m)  Wt 174 lb (78.926 kg)  BMI 25.68 kg/m2 General Appearance: Well developed well nourished, non-toxic appearing in no apparent distress. Eyes: PERRLA, EOMs, conjunctiva w/ no swelling or erythema or discharge Sinuses: No Frontal/maxillary tenderness ENT/Mouth: Ear canals clear without swelling or erythema.  TM's normal bilaterally with no retractions, bulging, or loss of landmarks.   Neck: Supple, thyroid normal, no notable JVD  Respiratory: Respiratory effort normal, Clear breath sounds anteriorly and posteriorly bilaterally without rales, rhonchi, wheezing or stridor. No retractions or accessory  muscle usage. Cardio: RRR with no MRGs.   Abdomen: Soft, + BS.  Non tender, no guarding, rebound, hernias, masses.  Musculoskeletal: Full ROM, 5/5 strength, normal gait.  Skin: Warm, dry without rashes  Neuro: Awake and oriented X 3, Cranial nerves intact. Normal muscle tone, no cerebellar symptoms. Sensation intact.  Psych: normal affect, Insight and Judgment appropriate.     Terri Piedra, PA-C 3:34 PM [facility]

## 2015-07-27 LAB — GC/CHLAMYDIA PROBE AMP, URINE
Chlamydia, Swab/Urine, PCR: NEGATIVE
GC PROBE AMP, URINE: NEGATIVE

## 2015-07-27 LAB — RPR

## 2015-07-27 LAB — HSV(HERPES SIMPLEX VRS) I + II AB-IGG
HSV 1 Glycoprotein G Ab, IgG: <0.1 IV
HSV 2 Glycoprotein G Ab, IgG: <0.1 IV

## 2015-07-27 LAB — HIV ANTIBODY (ROUTINE TESTING W REFLEX): HIV: NONREACTIVE

## 2016-02-14 ENCOUNTER — Emergency Department (HOSPITAL_COMMUNITY): Payer: BLUE CROSS/BLUE SHIELD

## 2016-02-14 ENCOUNTER — Encounter (HOSPITAL_COMMUNITY): Payer: Self-pay | Admitting: Emergency Medicine

## 2016-02-14 ENCOUNTER — Emergency Department (HOSPITAL_COMMUNITY)
Admission: EM | Admit: 2016-02-14 | Discharge: 2016-02-14 | Disposition: A | Discharge status: Home / Self Care | Payer: BLUE CROSS/BLUE SHIELD | Attending: Emergency Medicine | Admitting: Emergency Medicine

## 2016-02-14 DIAGNOSIS — Z8639 Personal history of other endocrine, nutritional and metabolic disease: Secondary | ICD-10-CM | POA: Insufficient documentation

## 2016-02-14 DIAGNOSIS — S199XXA Unspecified injury of neck, initial encounter: Secondary | ICD-10-CM | POA: Diagnosis not present

## 2016-02-14 DIAGNOSIS — F17200 Nicotine dependence, unspecified, uncomplicated: Secondary | ICD-10-CM | POA: Diagnosis not present

## 2016-02-14 DIAGNOSIS — Y9389 Activity, other specified: Secondary | ICD-10-CM | POA: Diagnosis not present

## 2016-02-14 DIAGNOSIS — Y92410 Unspecified street and highway as the place of occurrence of the external cause: Secondary | ICD-10-CM | POA: Diagnosis not present

## 2016-02-14 DIAGNOSIS — Y998 Other external cause status: Secondary | ICD-10-CM | POA: Diagnosis not present

## 2016-02-14 DIAGNOSIS — M542 Cervicalgia: Secondary | ICD-10-CM

## 2016-02-14 MED ORDER — ACETAMINOPHEN 325 MG PO TABS
650.0000 mg | ORAL_TABLET | Freq: Once | ORAL | Status: AC
  Administered 2016-02-14: 650 mg via ORAL
  Filled 2016-02-14: qty 2

## 2016-02-14 MED ORDER — IBUPROFEN 400 MG PO TABS
800.0000 mg | ORAL_TABLET | Freq: Once | ORAL | Status: AC
  Administered 2016-02-14: 800 mg via ORAL
  Filled 2016-02-14: qty 2

## 2016-02-14 MED ORDER — IBUPROFEN 800 MG PO TABS: 800.0000 mg | ORAL_TABLET | Freq: Three times a day (TID) | ORAL | Status: DC
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
## 2016-02-14 NOTE — ED Notes (Signed)
Patient able to ambulate independently  
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
## 2016-02-23 ENCOUNTER — Encounter: Payer: Self-pay | Admitting: Internal Medicine

## 2016-02-23 ENCOUNTER — Ambulatory Visit (INDEPENDENT_AMBULATORY_CARE_PROVIDER_SITE_OTHER): Payer: BLUE CROSS/BLUE SHIELD | Admitting: Internal Medicine

## 2016-02-23 VITALS — BP 116/70 | HR 84 | Temp 98.2°F | Resp 16 | Ht 69.0 in | Wt 188.0 lb

## 2016-02-23 DIAGNOSIS — Z0000 Encounter for general adult medical examination without abnormal findings: Secondary | ICD-10-CM

## 2016-02-23 DIAGNOSIS — Z79899 Other long term (current) drug therapy: Secondary | ICD-10-CM | POA: Diagnosis not present

## 2016-02-23 DIAGNOSIS — Z131 Encounter for screening for diabetes mellitus: Secondary | ICD-10-CM

## 2016-02-23 DIAGNOSIS — E559 Vitamin D deficiency, unspecified: Secondary | ICD-10-CM

## 2016-02-23 DIAGNOSIS — Z13220 Encounter for screening for lipoid disorders: Secondary | ICD-10-CM

## 2016-02-23 DIAGNOSIS — Z1389 Encounter for screening for other disorder: Secondary | ICD-10-CM

## 2016-02-23 DIAGNOSIS — Z1329 Encounter for screening for other suspected endocrine disorder: Secondary | ICD-10-CM

## 2016-02-23 DIAGNOSIS — Z130 Encounter for screening for diseases of the blood and blood-forming organs and certain disorders involving the immune mechanism: Secondary | ICD-10-CM

## 2016-02-23 LAB — LIPID PANEL
Cholesterol: 177 mg/dL (ref 125–200)
HDL: 64 mg/dL (ref >=40)
LDL CALC: 100 mg/dL (ref <130)
Total CHOL/HDL Ratio: 2.8 Ratio (ref <=5.0)
Triglycerides: 63 mg/dL (ref <150)
VLDL: 13 mg/dL (ref <30)

## 2016-02-23 LAB — CBC WITH DIFFERENTIAL/PLATELET
BASOS ABS: 0 10*3/uL (ref 0.0–0.1)
Basophils Relative: 0 % (ref 0–1)
EOS PCT: 2 % (ref 0–5)
Eosinophils Absolute: 0.2 10*3/uL (ref 0.0–0.7)
HEMATOCRIT: 45.5 % (ref 39.0–52.0)
Hemoglobin: 15.7 g/dL (ref 13.0–17.0)
LYMPHS ABS: 2.1 10*3/uL (ref 0.7–4.0)
Lymphocytes Relative: 27 % (ref 12–46)
MCH: 32.8 pg (ref 26.0–34.0)
MCHC: 34.5 g/dL (ref 30.0–36.0)
MCV: 95.2 fL (ref 78.0–100.0)
MONOS PCT: 9 % (ref 3–12)
MPV: 11 fL (ref 8.6–12.4)
Monocytes Absolute: 0.7 10*3/uL (ref 0.1–1.0)
Neutro Abs: 4.7 10*3/uL (ref 1.7–7.7)
Neutrophils Relative %: 62 % (ref 43–77)
Platelets: 209 10*3/uL (ref 150–400)
RBC: 4.78 MIL/uL (ref 4.22–5.81)
RDW: 13.3 % (ref 11.5–15.5)
WBC: 7.6 10*3/uL (ref 4.0–10.5)

## 2016-02-23 LAB — IRON AND TIBC
%SAT: 42 % (ref 15–60)
IRON: 164 ug/dL (ref 50–180)
TIBC: 388 ug/dL (ref 250–425)
UIBC: 224 ug/dL (ref 125–400)

## 2016-02-23 LAB — BASIC METABOLIC PANEL WITH GFR
BUN: 13 mg/dL (ref 7–25)
CHLORIDE: 103 mmol/L (ref 98–110)
CO2: 27 mmol/L (ref 20–31)
CREATININE: 0.75 mg/dL (ref 0.60–1.35)
Calcium: 9.3 mg/dL (ref 8.6–10.3)
GFR, Est African American: >89 mL/min (ref >=60)
GFR, Est Non African American: >89 mL/min (ref >=60)
GLUCOSE: 100 mg/dL — AB (ref 65–99)
POTASSIUM: 4.3 mmol/L (ref 3.5–5.3)
Sodium: 139 mmol/L (ref 135–146)

## 2016-02-23 LAB — HEPATIC FUNCTION PANEL
ALK PHOS: 67 U/L (ref 40–115)
ALT: 26 U/L (ref 9–46)
AST: 19 U/L (ref 10–40)
Albumin: 4.6 g/dL (ref 3.6–5.1)
Bilirubin, Direct: 0.3 mg/dL — ABNORMAL HIGH (ref <=0.2)
Indirect Bilirubin: 1 mg/dL (ref 0.2–1.2)
TOTAL PROTEIN: 6.8 g/dL (ref 6.1–8.1)
Total Bilirubin: 1.3 mg/dL — ABNORMAL HIGH (ref 0.2–1.2)

## 2016-02-23 LAB — HEMOGLOBIN A1C
Hgb A1c MFr Bld: 4.9 % (ref <5.7)
Mean Plasma Glucose: 94 mg/dL (ref <117)

## 2016-02-23 LAB — VITAMIN B12: VITAMIN B 12: 574 pg/mL (ref 200–1100)

## 2016-02-23 LAB — TSH: TSH: 2.02 mIU/L (ref 0.40–4.50)

## 2016-02-23 LAB — MAGNESIUM: Magnesium: 2.1 mg/dL (ref 1.5–2.5)
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
## 2016-02-24 LAB — VITAMIN D 25 HYDROXY (VIT D DEFICIENCY, FRACTURES): Vit D, 25-Hydroxy: 11 ng/mL — ABNORMAL LOW (ref 30–100)

## 2016-02-24 LAB — MICROALBUMIN / CREATININE URINE RATIO
Creatinine, Urine: 268 mg/dL (ref 20–370)
Microalb Creat Ratio: 2 mcg/mg creat (ref <30)
Microalb, Ur: 0.6 mg/dL

## 2016-02-24 LAB — URINALYSIS, ROUTINE W REFLEX MICROSCOPIC
Bilirubin Urine: NEGATIVE
GLUCOSE, UA: NEGATIVE
HGB URINE DIPSTICK: NEGATIVE
KETONES UR: NEGATIVE
Leukocytes, UA: NEGATIVE
NITRITE: NEGATIVE
PH: 5.5 (ref 5.0–8.0)
PROTEIN: NEGATIVE
Specific Gravity, Urine: 1.027 (ref 1.001–1.035)

## 2016-02-26 LAB — INSULIN, RANDOM: INSULIN: 4.5 u[IU]/mL (ref 2.0–19.6)

## 2016-03-07 ENCOUNTER — Encounter: Payer: Self-pay | Admitting: Internal Medicine

## 2016-06-06 DIAGNOSIS — H5213 Myopia, bilateral: Secondary | ICD-10-CM | POA: Diagnosis not present

## 2016-07-02 DIAGNOSIS — H11433 Conjunctival hyperemia, bilateral: Secondary | ICD-10-CM | POA: Diagnosis not present

## 2016-11-26 DIAGNOSIS — R52 Pain, unspecified: Secondary | ICD-10-CM | POA: Diagnosis not present

## 2017-02-13 ENCOUNTER — Encounter: Payer: Self-pay | Admitting: Internal Medicine

## 2017-02-13 ENCOUNTER — Ambulatory Visit (INDEPENDENT_AMBULATORY_CARE_PROVIDER_SITE_OTHER): Payer: BLUE CROSS/BLUE SHIELD | Admitting: Internal Medicine

## 2017-02-13 VITALS — BP 116/64 | HR 82 | Temp 98.2°F | Resp 16 | Ht 69.0 in | Wt 212.0 lb

## 2017-02-13 DIAGNOSIS — N50812 Left testicular pain: Secondary | ICD-10-CM | POA: Diagnosis not present

## 2017-02-13 MED ORDER — DOXYCYCLINE HYCLATE 100 MG PO CAPS: 100.0000 mg | ORAL_CAPSULE | Freq: Two times a day (BID) | ORAL | 0 refills | Status: DC

## 2017-02-13 NOTE — Progress Notes (Signed)
Assessment and Plan:   1. Testicular pain, left Neprholithiasis vs. Epididymitis vs. Prostatitis.  Given age will cover with doxycycline for possible G/C infection vs. E. Coli.  doxycycline for possible G/C infection vs. E. Coli.  No new sexual partners per report so may be low risk.  Will check for it.  If hematuria significant on UA will send for CT abdomen pelvis vs. Possible ultrasound.  Patient is in aggreement.   - Urinalysis, Routine w reflex microscopic - Culture, Urine - doxycycline (VIBRAMYCIN) 100 MG capsule; Take 1 capsule (100 mg total) by mouth 2 (two) times daily. One po bid x 21 days  Dispense: 42 capsule; Refill: 0 - GC/chlamydia probe amp, urine - GC/Chlamydia Probe Amp    HPI 31 y.o.male presents for evaluation of lower abdominal pain which radiates to his left testiscle.  He reports that the pain is intermittent and seems to come and go.  He reports that he is having some urinary urgency.  He is not having any type of hesitancy.  He is not having hematuria.  He has no foul smelling urine.  He has no history of kidney stones.  He has had some pain after having sex.  No penile discharge.  No new sexual partners.  He has not had any changes in his bowel habits.  No hematochezia or melena.  No fevers chills, N/V.  He has been having some lower back pain as well.  This is not new.  He is seeing a chiropractor for this.  He does drink a lot of soda.    Past MedicLandal History:  Diagnosis Date  . Alcohol abuse 09/14/12   Patient does not desire to quit at this time  . Hyperlipidemia   . Vitamin D deficiency      No Known Allergies    No current outpatient prescriptions on file prior to visit.   No current facility-administered medications on file prior to visit.     ROS: all negative except above.   Physical Exam: Filed Weights   02/13/17 1001  Weight: 212 lb (96.2 kg)   BP 116/64   Pulse 82   Temp 98.2 F (36.8 C) (Temporal)   Resp 16   Ht 5\' 9"  (1.753 m)   Wt 212 lb (96.2 kg)   BMI 31.31 kg/m  General  Appearance: Well developed well nourished, non-toxic appearing in no apparent distress. Eyes: PERRLA, EOMs, conjunctiva w/ no swelling or erythema or discharge Sinuses: No Frontal/maxillary tenderness ENT/Mouth: Ear canals clear without swelling or erythema.  TM's normal bilaterally with no retractions, bulging, or loss of landmarks.   Neck: Supple, thyroid normal, no notable JVD  Respiratory: Respiratory effort normal, Clear breath sounds anteriorly and posteriorly bilaterally without rales, rhonchi, wheezing or stridor. No retractions or accessory muscle usage. Cardio: RRR with no MRGs.   Abdomen: Soft, + BS.  Non tender, no guarding, rebound, hernias, masses. No CVA tenderness to palpation bilaterally. GU:  Normal circumcised penis.  Non-tender to palpation.  No penile discharge.  No testicular tenderness to palpation.  Normal scrotum.  No inguinal hernias.   Musculoskeletal: Full ROM, 5/5 strength, normal gait.  Skin: Warm, dry without rashes  Neuro: Awake and oriented X 3, Cranial nerves intact. Normal muscle tone, no cerebellar symptoms. Sensation intact.  Psych: normal affect, Insight and Judgment appropriate.     Terri Piedraourtney Forcucci, PA-C 10:16 AM [facility]

## 2017-02-13 NOTE — Patient Instructions (Signed)
Doxycycline delayed-release capsules What is this medicine? DOXYCYCLINE (dox i SYE kleen) is a tetracycline antibiotic. It is used to treat certain kinds of bacterial infections, Lyme disease, and malaria. It will not work for colds, flu, or other viral infections. This medicine may be used for other purposes; ask your health care provider or pharmacist if you have questions. COMMON BRAND NAME(S): Oracea What should I tell my health care provider before I take this medicine? They need to know if you have any of these conditions: -bowel disease like colitis -liver disease -long exposure to sunlight like working outdoors -an unusual or allergic reaction to doxycycline, tetracycline antibiotics, other medicines, foods, dyes, or preservatives -pregnant or trying to get pregnant -breast-feeding How should I use this medicine? Take this medicine by mouth with a full glass of water. Follow the directions on the prescription label. Do not crush or chew. The capsules may be opened and the pellets sprinkled on applesauce. Swallow the pellets whole without chewing. Follow with an 8 ounce glass of water to help you swallow all the pellets. Do not prepare a dose and store for later use. The applesauce mixture should be taken immediately after you prepare it. It is best to take this medicine without other food, but if it upsets your stomach take it with food. Take your medicine at regular intervals. Do not take your medicine more often than directed. Take all of your medicine as directed even if you think your are better. Do not skip doses or stop your medicine early. Talk to your pediatrician regarding the use of this medicine in children. While this drug may be prescribed for selected conditions, precautions do apply. Overdosage: If you think you have taken too much of this medicine contact a poison control center or emergency room at once. NOTE: This medicine is only for you. Do not share this medicine with  others. What if I miss a dose? If you miss a dose, take it as soon as you can. If it is almost time for your next dose, take only that dose. Do not take double or extra doses. What may interact with this medicine? -antacids -barbiturates -birth control pills -bismuth subsalicylate -carbamazepine -methoxyflurane -other antibiotics -phenytoin -vitamins that contain iron -warfarin This list may not describe all possible interactions. Give your health care provider a list of all the medicines, herbs, non-prescription drugs, or dietary supplements you use. Also tell them if you smoke, drink alcohol, or use illegal drugs. Some items may interact with your medicine. What should I watch for while using this medicine? Tell your doctor or health care professional if your symptoms do not improve. Do not treat diarrhea with over the counter products. Contact your doctor if you have diarrhea that lasts more than 2 days or if it is severe and watery. Do not take this medicine just before going to bed. It may not dissolve properly when you lay down and can cause pain in your throat. Drink plenty of fluids while taking this medicine to also help reduce irritation in your throat. This medicine can make you more sensitive to the sun. Keep out of the sun. If you cannot avoid being in the sun, wear protective clothing and use sunscreen. Do not use sun lamps or tanning beds/booths. If you are being treated for a sexually transmitted infection, avoid sexual contact until you have finished your treatment. Your sexual partner may also need treatment. Avoid antacids, aluminum, calcium, magnesium, and iron products for 4 hours before and   2 hours after taking a dose of this medicine. Birth control pills may not work properly while you are taking this medicine. Talk to your doctor about using an extra method of birth control. If you are using this medicine to prevent malaria, you should still protect yourself from  contact with mosquitos. Stay in screened-in areas, use mosquito nets, keep your body covered, and use an insect repellent. What side effects may I notice from receiving this medicine? Side effects that you should report to your doctor or health care professional as soon as possible: -allergic reactions like skin rash, itching or hives, swelling of the face, lips, or tongue -difficulty breathing -fever -itching in the rectal or genital area -pain on swallowing -redness, blistering, peeling or loosening of the skin, including inside the mouth -severe stomach pain or cramps -unusual bleeding or bruising -unusually weak or tired -yellowing of the eyes or skin Side effects that usually do not require medical attention (report to your doctor or health care professional if they continue or are bothersome): -diarrhea -loss of appetite -nausea, vomiting This list may not describe all possible side effects. Call your doctor for medical advice about side effects. You may report side effects to FDA at 1-800-FDA-1088. Where should I keep my medicine? Keep out of the reach of children. Store at room temperature, below 25 degrees C (77 degrees F). Protect from light. Keep container tightly closed. Throw away any unused medicine after the expiration date. Taking this medicine after the expiration date can make you seriously ill. NOTE: This sheet is a summary. It may not cover all possible information. If you have questions about this medicine, talk to your doctor, pharmacist, or health care provider.  2018 Elsevier/Gold Standard (2015-12-28 10:41:39)  

## 2017-02-14 LAB — URINALYSIS, ROUTINE W REFLEX MICROSCOPIC
Bilirubin Urine: NEGATIVE
Glucose, UA: NEGATIVE
Hgb urine dipstick: NEGATIVE
Ketones, ur: NEGATIVE
LEUKOCYTES UA: NEGATIVE
NITRITE: NEGATIVE
Protein, ur: NEGATIVE
Specific Gravity, Urine: 1.022 (ref 1.001–1.035)
pH: 6 (ref 5.0–8.0)

## 2017-02-14 LAB — URINALYSIS, MICROSCOPIC ONLY
Bacteria, UA: NONE SEEN [HPF]
Casts: NONE SEEN [LPF]
Crystals: NONE SEEN [HPF]
RBC / HPF: NONE SEEN RBC/HPF (ref <=2)
Squamous Epithelial / LPF: NONE SEEN [HPF] (ref <=5)
Yeast: NONE SEEN [HPF]

## 2017-02-14 LAB — GC/CHLAMYDIA PROBE AMP
CT Probe RNA: NOT DETECTED
GC PROBE AMP APTIMA: NOT DETECTED

## 2017-02-15 LAB — URINE CULTURE: ORGANISM ID, BACTERIA: NO GROWTH

## 2017-03-10 ENCOUNTER — Encounter: Payer: Self-pay | Admitting: Internal Medicine

## 2017-03-11 ENCOUNTER — Ambulatory Visit (INDEPENDENT_AMBULATORY_CARE_PROVIDER_SITE_OTHER): Payer: BLUE CROSS/BLUE SHIELD | Admitting: Internal Medicine

## 2017-03-11 ENCOUNTER — Encounter: Payer: Self-pay | Admitting: Internal Medicine

## 2017-03-11 VITALS — BP 118/62 | HR 74 | Temp 98.4°F | Resp 16 | Ht 69.0 in | Wt 208.0 lb

## 2017-03-11 DIAGNOSIS — E349 Endocrine disorder, unspecified: Secondary | ICD-10-CM

## 2017-03-11 DIAGNOSIS — Z79899 Other long term (current) drug therapy: Secondary | ICD-10-CM

## 2017-03-11 DIAGNOSIS — E559 Vitamin D deficiency, unspecified: Secondary | ICD-10-CM

## 2017-03-11 DIAGNOSIS — Z130 Encounter for screening for diseases of the blood and blood-forming organs and certain disorders involving the immune mechanism: Secondary | ICD-10-CM

## 2017-03-11 DIAGNOSIS — I10 Essential (primary) hypertension: Secondary | ICD-10-CM | POA: Diagnosis not present

## 2017-03-11 DIAGNOSIS — Z0000 Encounter for general adult medical examination without abnormal findings: Secondary | ICD-10-CM | POA: Diagnosis not present

## 2017-03-11 DIAGNOSIS — E782 Mixed hyperlipidemia: Secondary | ICD-10-CM

## 2017-03-11 DIAGNOSIS — Z1329 Encounter for screening for other suspected endocrine disorder: Secondary | ICD-10-CM

## 2017-03-11 DIAGNOSIS — Z131 Encounter for screening for diabetes mellitus: Secondary | ICD-10-CM

## 2017-03-11 DIAGNOSIS — Z136 Encounter for screening for cardiovascular disorders: Secondary | ICD-10-CM | POA: Diagnosis not present

## 2017-03-11 DIAGNOSIS — N50819 Testicular pain, unspecified: Secondary | ICD-10-CM

## 2017-03-11 DIAGNOSIS — Z1389 Encounter for screening for other disorder: Secondary | ICD-10-CM

## 2017-03-11 LAB — CBC WITH DIFFERENTIAL/PLATELET
BASOS PCT: 0 %
Basophils Absolute: 0 cells/uL (ref 0–200)
EOS PCT: 2 %
Eosinophils Absolute: 132 cells/uL (ref 15–500)
HCT: 45.3 % (ref 38.5–50.0)
Hemoglobin: 15.7 g/dL (ref 13.2–17.1)
LYMPHS PCT: 19 %
Lymphs Abs: 1254 cells/uL (ref 850–3900)
MCH: 33.5 pg — AB (ref 27.0–33.0)
MCHC: 34.7 g/dL (ref 32.0–36.0)
MCV: 96.8 fL (ref 80.0–100.0)
MONOS PCT: 10 %
MPV: 10.3 fL (ref 7.5–12.5)
Monocytes Absolute: 660 cells/uL (ref 200–950)
NEUTROS ABS: 4554 {cells}/uL (ref 1500–7800)
Neutrophils Relative %: 69 %
PLATELETS: 199 10*3/uL (ref 140–400)
RBC: 4.68 MIL/uL (ref 4.20–5.80)
RDW: 13.5 % (ref 11.0–15.0)
WBC: 6.6 10*3/uL (ref 3.8–10.8)

## 2017-03-11 LAB — HEPATIC FUNCTION PANEL
ALBUMIN: 4.1 g/dL (ref 3.6–5.1)
ALT: 44 U/L (ref 9–46)
AST: 26 U/L (ref 10–40)
Alkaline Phosphatase: 64 U/L (ref 40–115)
BILIRUBIN TOTAL: 1 mg/dL (ref 0.2–1.2)
Bilirubin, Direct: 0.2 mg/dL (ref <=0.2)
Indirect Bilirubin: 0.8 mg/dL (ref 0.2–1.2)
TOTAL PROTEIN: 6.5 g/dL (ref 6.1–8.1)

## 2017-03-11 LAB — LIPID PANEL
CHOLESTEROL: 175 mg/dL (ref <200)
HDL: 52 mg/dL (ref >40)
LDL CALC: 104 mg/dL — AB (ref <100)
TRIGLYCERIDES: 96 mg/dL (ref <150)
Total CHOL/HDL Ratio: 3.4 Ratio (ref <5.0)
VLDL: 19 mg/dL (ref <30)

## 2017-03-11 LAB — URINALYSIS, ROUTINE W REFLEX MICROSCOPIC
Bilirubin Urine: NEGATIVE
Glucose, UA: NEGATIVE
HGB URINE DIPSTICK: NEGATIVE
KETONES UR: NEGATIVE
LEUKOCYTES UA: NEGATIVE
NITRITE: NEGATIVE
PH: 6.5 (ref 5.0–8.0)
PROTEIN: NEGATIVE
Specific Gravity, Urine: 1.027 (ref 1.001–1.035)

## 2017-03-11 LAB — BASIC METABOLIC PANEL WITH GFR
BUN: 12 mg/dL (ref 7–25)
CALCIUM: 9.3 mg/dL (ref 8.6–10.3)
CO2: 25 mmol/L (ref 20–31)
CREATININE: 0.8 mg/dL (ref 0.60–1.35)
Chloride: 106 mmol/L (ref 98–110)
GFR, Est Non African American: >89 mL/min (ref >=60)
GLUCOSE: 100 mg/dL — AB (ref 65–99)
POTASSIUM: 3.9 mmol/L (ref 3.5–5.3)
Sodium: 141 mmol/L (ref 135–146)

## 2017-03-11 LAB — TSH: TSH: 1 mIU/L (ref 0.40–4.50)

## 2017-03-11 LAB — IRON AND TIBC
%SAT: 36 % (ref 15–60)
IRON: 129 ug/dL (ref 50–180)
TIBC: 362 ug/dL (ref 250–425)
UIBC: 233 ug/dL (ref 125–400)

## 2017-03-11 NOTE — Progress Notes (Signed)
Annual Screening Comprehensive Examination   This very nice 31 y.o.male presents for complete physical.  Patient has a history of diet controlled hyperlipidemia.  He is not currently on any medications.    He was recently treated for a case of epididymitis.  He finished all his antibiotic.  He still feels like he has some pressure in his testicles.  He reports that he would like to see a urologist.    Finally, patient has history of Vitamin D Deficiency and last vitamin D was  Lab Results  Component Value Date   VD25OH 11 (L) 02/23/2016  .  Currently on supplementation  He has no changes in family history.   He is having his vision checked yearly.  He goes to the dentist once yearly.     No current outpatient prescriptions on file prior to visit.   No current facility-administered medications on file prior to visit.     No Known Allergies  Past Medical History:  Diagnosis Date  . Alcohol abuse 09/14/12   Patient does not desire to quit at this time  . Hyperlipidemia   . Vitamin D deficiency     Immunization History  Administered Date(s) Administered  . HPV Quadrivalent 02/27/2010  . PPD Test 01/24/2014, 03/08/2015  . Tdap 02/01/2009    Past Surgical History:  Procedure Laterality Date  . APPENDECTOMY     31 year old    Family History  Problem Relation Age of Onset  . Diabetes Mother   . Hypertension Mother   . Alcohol abuse Father   . Cancer Maternal Grandmother     BREAST  . Stroke Maternal Grandfather   . Cancer Paternal Grandmother     lung    Social History   Social History  . Marital status: Single    Spouse name: N/A  . Number of children: N/A  . Years of education: N/A   Occupational History  . Not on file.   Social History Main Topics  . Smoking status: Current Some Day Smoker  . Smokeless tobacco: Never Used  . Alcohol use 0.0 oz/week  . Drug use: Yes    Types: Marijuana  . Sexual activity: Not on file   Other Topics Concern  .  Not on file   Social History Narrative  . No narrative on file    Review of Systems  Constitutional: Negative for chills, fever and malaise/fatigue.  HENT: Negative for congestion, ear pain and sore throat.   Eyes: Negative.   Respiratory: Negative for cough, shortness of breath and wheezing.   Cardiovascular: Negative for chest pain, palpitations and leg swelling.  Gastrointestinal: Negative for abdominal pain, blood in stool, constipation, diarrhea, heartburn and melena.  Genitourinary: Negative.        Testicular discomfort  Skin: Negative.   Neurological: Negative for dizziness, sensory change, loss of consciousness and headaches.  Psychiatric/Behavioral: Negative for depression. The patient is not nervous/anxious and does not have insomnia.       Physical Exam  BP 118/62   Pulse 74   Temp 98.4 F (36.9 C) (Temporal)   Resp 16   Ht  (1.753 m)   Wt 208 lb (94.3 kg)   BMI 30.72 kg/m   General Appearance: Well nourished and in no apparent distress. Eyes: PERRLA, EOMs, conjunctiva no swelling or erythema, normal fundi and vessels. Sinuses: No frontal/maxillary tenderness ENT/Mouth: EACs patent / TMs  nl. Nares clear without erythema, swelling, mucoid exudates. Oral hygiene is good. No  erythema, swelling, or exudate. Tongue normal, non-obstructing. Tonsils not swollen or erythematous. Hearing normal.  Neck: Supple, thyroid normal. No bruits, nodes or JVD. Respiratory: Respiratory effort normal.  BS equal and clear bilateral without rales, rhonci, wheezing or stridor. Cardio: Heart sounds are normal with regular rate and rhythm and no murmurs, rubs or gallops. Peripheral pulses are normal and equal bilaterally without edema. No aortic or femoral bruits. Chest: symmetric with normal excursions and percussion. Abdomen: Flat, soft, with bowl sounds. Nontender, no guarding, rebound, hernias, masses, or organomegaly.  Lymphatics: Non tender without lymphadenopathy.   Musculoskeletal: Full ROM all peripheral extremities, joint stability, 5/5 strength, and normal gait. Skin: Warm and dry without rashes, lesions, cyanosis, clubbing or  ecchymosis.  Neuro: Cranial nerves intact, reflexes equal bilaterally. Normal muscle tone, no cerebellar symptoms. Sensation intact.  Pysch: Awake and oriented X 3, normal affect, Insight and Judgment appropriate.   Assessment and Plan   1. Routine general medical examination at a health care facility -due next year  2. Testicular pain -refer to last note for genital exam, no evidence of hydrocele, varicocele.  STD testing normal. No palpable mass.  UA normal - Ambulatory referral to Urology  3. Screening for diabetes mellitus  - Hemoglobin A1c - Insulin, random  4. Mixed hyperlipidemia -cont diet -exercise recommended - Lipid panel  5. Screening for deficiency anemia  - Iron and TIBC - Vitamin B12  6. Testosterone deficiency  - Testosterone  7. Screening for hematuria or proteinuria  - Urinalysis, Routine w reflex microscopic - Microalbumin / creatinine urine ratio  8. Screening for cardiovascular condition  - EKG 12-Lead  9. Vitamin D deficiency -cont VIt D supplement - VITAMIN D 25 Hydroxy (Vit-D Deficiency, Fractures)  10. Screening for thyroid disorder  - TSH  11. Medication management  - CBC with Differential/Platelet - BASIC METABOLIC PANEL WITH GFR - Hepatic function panel - Magnesium Normal EKG without changes from 2016.    Continue prudent diet as discussed, weight control, regular exercise, and medications. Routine screening labs and tests as requested with regular follow-up as recommended.  Over 40 minutes of exam, counseling, chart review and critical decision making was performed

## 2017-03-12 LAB — VITAMIN D 25 HYDROXY (VIT D DEFICIENCY, FRACTURES): Vit D, 25-Hydroxy: 9 ng/mL — ABNORMAL LOW (ref 30–100)

## 2017-03-12 LAB — MICROALBUMIN / CREATININE URINE RATIO
CREATININE, URINE: 263 mg/dL (ref 20–370)
MICROALB UR: 0.9 mg/dL
MICROALB/CREAT RATIO: 3 ug/mg{creat} (ref <30)

## 2017-03-12 LAB — HEMOGLOBIN A1C
Hgb A1c MFr Bld: 4.5 % (ref <5.7)
Mean Plasma Glucose: 82 mg/dL

## 2017-03-12 LAB — VITAMIN B12: VITAMIN B 12: 449 pg/mL (ref 200–1100)

## 2017-03-12 LAB — INSULIN, RANDOM: INSULIN: 14.6 u[IU]/mL (ref 2.0–19.6)

## 2017-03-12 LAB — TESTOSTERONE: TESTOSTERONE: 520 ng/dL (ref 250–827)

## 2017-03-12 LAB — MAGNESIUM: MAGNESIUM: 2.1 mg/dL (ref 1.5–2.5)

## 2017-04-23 ENCOUNTER — Encounter: Payer: Self-pay | Admitting: *Deleted
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
## 2017-08-30 DIAGNOSIS — K625 Hemorrhage of anus and rectum: Secondary | ICD-10-CM | POA: Diagnosis not present

## 2017-08-30 DIAGNOSIS — R1084 Generalized abdominal pain: Secondary | ICD-10-CM | POA: Diagnosis not present

## 2017-12-24 ENCOUNTER — Encounter: Payer: Self-pay | Admitting: *Deleted

## 2018-03-12 ENCOUNTER — Encounter: Payer: Self-pay | Admitting: Internal Medicine

## 2018-04-15 DIAGNOSIS — M62830 Muscle spasm of back: Secondary | ICD-10-CM | POA: Diagnosis not present

## 2018-05-25 NOTE — Progress Notes (Signed)
[facility] [address] [place], [place] [address] Phone: [phone] Subjective:    I'm seeing this patient by the request  of:    CC: Low back pain  [id number]  Louis Martinez is a 32 y.o. male coming in with complaint of chronic lower back pain. 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 Patient states that his back "goes out." This occurs when he is at home not at work. Does have radiating pain into the left glute and hamstring. Patient stretches in morning to alleviate his pain. 0/10 today.      Past Medical History:  Diagnosis Date  . Alcohol abuse 09/14/12   Patient does not desire to quit at this time  . Hyperlipidemia   . Vitamin D deficiency    Past Surgical History:  Procedure Laterality Date  . APPENDECTOMY     32 year old   Social History   Socioeconomic History  . Marital status: Single    Spouse name: Not on file  . Number of children: Not on file  . Years of education: Not on file  . Highest education level: Not on file  Occupational History  . Not on file  Social Needs  . Financial resource strain: Not on file  . Food insecurity:    Worry: Not on file    Inability: Not on file  . Transportation needs:    Medical: Not on file    Non-medical: Not on file  Tobacco Use  . Smoking status: Current Some Day Smoker  . Smokeless tobacco: Never Used  Substance and Sexual Activity  . Alcohol use: Yes    Alcohol/week: 0.0 oz  . Drug use: Yes    Types: Marijuana  . Sexual activity: Not on file  Lifestyle  . Physical activity:    Days per week: Not on file    Minutes per session: Not on file  . Stress: Not on file  Relationships  . Social connections:    Talks on phone: Not on file    Gets together: Not on file    Attends religious service: Not on file    Active member of club or organization: Not on file    Attends meetings of clubs or organizations: Not on file    Relationship status: Not on file    Other Topics Concern  . Not on file  Social History Narrative  . Not on file   No Known Allergies Family History  Problem Relation Age of Onset  . Diabetes Mother   . Hypertension Mother   . Alcohol abuse Father   . Cancer Maternal Grandmother        BREAST  . Stroke Maternal Grandfather   . Cancer Paternal Grandmother        lung     Past medical history, social, surgical and family history all reviewed in electronic medical record.  No pertanent information unless stated regarding to the chief complaint.   Review of Systems:Review of systems updated and as accurate as of 05/26/18  No headache, visual changes, nausea, vomiting, diarrhea, constipation, dizziness, abdominal pain, skin rash, fevers, chills, night sweats, weight loss, swollen lymph nodes, body aches, joint swelling, muscle aches, chest pain, shortness of breath, mood changes.   Objective  Blood pressure 102/64, pulse 98, height 5\' 9"  (1.753 m), weight 223 lb (101.2 kg), SpO2 97 %. Systems examined below as of 05/26/18   General: No apparent distress alert and oriented  x3 mood and affect normal, dressed appropriately.  HEENT: Pupils equal, extraocular movements intact  Respiratory: Patient's speak in full sentences and does not appear short of breath  Cardiovascular: No lower extremity edema, non tender, no erythema  Skin: Warm dry intact with no signs of infection or rash on extremities or on axial skeleton.  Abdomen: Soft nontender  Neuro: Cranial nerves II through XII are intact, neurovascularly intact in all extremities with 2+ DTRs and 2+ pulses.  Lymph: No lymphadenopathy of posterior or anterior cervical chain or axillae bilaterally.  Gait normal with good balance and coordination.  MSK:  Non tender with full range of motion and good stability and symmetric strength and tone of shoulders, elbows, wrist, hip, knee and ankles bilaterally.  Back Exam:  Inspection: Increased lordosis poor core strength  noted Motion: Flexion 45 deg, Extension 25 deg, Side Bending to 35 deg bilaterally,  Rotation to 40 deg bilaterally  SLR laying: Negative  XSLR laying: Negative  Palpable tenderness: None. FABER: Tightness bilaterally. Sensory change: Gross sensation intact to all lumbar and sacral dermatomes.  Reflexes: 2+ at both patellar tendons, 2+ at achilles tendons, Babinski's downgoing.  Strength at foot  Plantar-flexion: 5/5 Dorsi-flexion: 5/5 Eversion: 5/5 Inversion: 5/5  Leg strength  Quad: 5/5 Hamstring: 5/5 Hip flexor: 5/5 Hip abductors: 5/5  Gait unremarkable.  [id number]; 15 additional minutes spent for Therapeutic exercises as stated in above notes.  This included exercises focusing on stretching, strengthening, with significant focus on eccentric aspects.   Long term goals include an improvement in range of motion, strength, endurance as well as avoiding reinjury. Patient's frequency would include in 1-2 times a day, 3-5 times a week for a duration of 6-12 weeks. Low back exercises that included:  Pelvic tilt/bracing instruction to focus on control of the pelvic girdle and lower abdominal muscles  Glute strengthening exercises, focusing on proper firing of the glutes without engaging the low back muscles Proper stretching techniques for maximum relief for the hamstrings, hip flexors, low back and some rotation where tolerated Proper technique shown and discussed handout in great detail with ATC.  All questions were discussed and answered.        Impression and Recommendations:     This case required medical decision making of moderate complexity.      Note: This dictation was prepared with Dragon dictation along with smaller phrase technology. Any transcriptional errors that result from this process are unintentional.

## 2018-05-26 ENCOUNTER — Encounter: Payer: Self-pay | Admitting: Family Medicine

## 2018-05-26 ENCOUNTER — Ambulatory Visit: Payer: BLUE CROSS/BLUE SHIELD | Admitting: Family Medicine

## 2018-05-26 VITALS — BP 102/64 | HR 98 | Ht 69.0 in | Wt 223.0 lb

## 2018-05-26 DIAGNOSIS — M545 Low back pain, unspecified: Secondary | ICD-10-CM

## 2018-05-26 DIAGNOSIS — M24559 Contracture, unspecified hip: Secondary | ICD-10-CM | POA: Diagnosis not present

## 2018-05-26 NOTE — Assessment & Plan Note (Signed)
I believe the patient's back pain is likely secondary to more of a hip flexor tightness.  We discussed icing regimen, home exercise, topical anti-inflammatories.  Given suggestions of other conservative therapies.  We discussed that if any worsening symptoms we will consider further aggressive therapies.  Follow-up with me again in 3 to 4 weeks

## 2018-05-26 NOTE — Patient Instructions (Signed)
Good to see you  Ice is your friend  Ice 20 minutes 2 times daily. Usually after activity and before bed. Exercises 3 times a week.  pennsaid pinkie amount topically 2 times daily as needed.  If having a flare consider Duexis 3 times a day for 3 days Over the counter get  Vitamin D 2000 Iu daily  Turmeric 500mg  daily  Tart cherry extract any dose at [facility] See me again in 4 weeks if not a lot better but I am guessing you will do well

## 2018-06-10 ENCOUNTER — Ambulatory Visit (INDEPENDENT_AMBULATORY_CARE_PROVIDER_SITE_OTHER): Payer: Self-pay | Admitting: Internal Medicine

## 2018-06-10 ENCOUNTER — Encounter: Payer: Self-pay | Admitting: Internal Medicine

## 2018-06-10 DIAGNOSIS — Z5329 Procedure and treatment not carried out because of patient's decision for other reasons: Secondary | ICD-10-CM

## 2018-06-10 NOTE — Progress Notes (Signed)
   N  O S  H  O  W                                   This very nice 32 y.o.male presents for a Screening /Preventative Visit & comprehensive evaluation and management of multiple medical co-morbidities.  Patient has been followed expectantly for HTN, HLD, Prediabetes and Vitamin D Deficiency.     HTN predates since     . Patient's BP has been controlled at home.  Today's  . Patient denies any cardiac symptoms as chest pain, palpitations, shortness of breath, dizziness or ankle swelling.     Patient's hyperlipidemia is controlled with diet and medications. Patient denies myalgias or other medication SE's. Last lipids were  Lab Results  Component Value Date   CHOL 175 03/11/2017   HDL 52 03/11/2017   LDLCALC 104 (H) 03/11/2017   TRIG 96 03/11/2017   CHOLHDL 3.4 03/11/2017      Patient has is overweight (BMI 30+) and is screened expectantly for prediabetes  and patient denies reactive hypoglycemic symptoms, visual blurring, diabetic polys or paresthesias. Last A1c was  Lab Results  Component Value Date   HGBA1C 4.5 03/11/2017       Finally, patient has history of Vitamin D Deficiency of    and last vitamin D was  Lab Results  Component Value Date   VD25OH 9 (L) 03/11/2017

## 2018-07-07 ENCOUNTER — Encounter: Payer: Self-pay | Admitting: Internal Medicine

## 2018-07-07 ENCOUNTER — Ambulatory Visit (INDEPENDENT_AMBULATORY_CARE_PROVIDER_SITE_OTHER): Payer: BLUE CROSS/BLUE SHIELD | Admitting: Internal Medicine

## 2018-07-07 VITALS — BP 138/84 | HR 76 | Temp 97.3°F | Resp 18 | Ht 69.5 in | Wt 225.2 lb

## 2018-07-07 DIAGNOSIS — E559 Vitamin D deficiency, unspecified: Secondary | ICD-10-CM | POA: Diagnosis not present

## 2018-07-07 DIAGNOSIS — Z1211 Encounter for screening for malignant neoplasm of colon: Secondary | ICD-10-CM

## 2018-07-07 DIAGNOSIS — R7309 Other abnormal glucose: Secondary | ICD-10-CM

## 2018-07-07 DIAGNOSIS — Z131 Encounter for screening for diabetes mellitus: Secondary | ICD-10-CM | POA: Diagnosis not present

## 2018-07-07 DIAGNOSIS — Z202 Contact with and (suspected) exposure to infections with a predominantly sexual mode of transmission: Secondary | ICD-10-CM

## 2018-07-07 DIAGNOSIS — Z113 Encounter for screening for infections with a predominantly sexual mode of transmission: Secondary | ICD-10-CM

## 2018-07-07 DIAGNOSIS — Z13220 Encounter for screening for lipoid disorders: Secondary | ICD-10-CM | POA: Diagnosis not present

## 2018-07-07 DIAGNOSIS — Z1159 Encounter for screening for other viral diseases: Secondary | ICD-10-CM | POA: Diagnosis not present

## 2018-07-07 DIAGNOSIS — Z1329 Encounter for screening for other suspected endocrine disorder: Secondary | ICD-10-CM | POA: Diagnosis not present

## 2018-07-07 DIAGNOSIS — R030 Elevated blood-pressure reading, without diagnosis of hypertension: Secondary | ICD-10-CM | POA: Diagnosis not present

## 2018-07-07 DIAGNOSIS — R5383 Other fatigue: Secondary | ICD-10-CM

## 2018-07-07 DIAGNOSIS — Z23 Encounter for immunization: Secondary | ICD-10-CM | POA: Diagnosis not present

## 2018-07-07 DIAGNOSIS — Z1212 Encounter for screening for malignant neoplasm of rectum: Secondary | ICD-10-CM

## 2018-07-07 DIAGNOSIS — Z136 Encounter for screening for cardiovascular disorders: Secondary | ICD-10-CM | POA: Diagnosis not present

## 2018-07-07 DIAGNOSIS — Z8249 Family history of ischemic heart disease and other diseases of the circulatory system: Secondary | ICD-10-CM

## 2018-07-07 DIAGNOSIS — E349 Endocrine disorder, unspecified: Secondary | ICD-10-CM

## 2018-07-07 DIAGNOSIS — Z111 Encounter for screening for respiratory tuberculosis: Secondary | ICD-10-CM

## 2018-07-07 DIAGNOSIS — F17200 Nicotine dependence, unspecified, uncomplicated: Secondary | ICD-10-CM

## 2018-07-07 DIAGNOSIS — Z130 Encounter for screening for diseases of the blood and blood-forming organs and certain disorders involving the immune mechanism: Secondary | ICD-10-CM

## 2018-07-07 DIAGNOSIS — Z0000 Encounter for general adult medical examination without abnormal findings: Secondary | ICD-10-CM | POA: Diagnosis not present

## 2018-07-07 DIAGNOSIS — H5213 Myopia, bilateral: Secondary | ICD-10-CM | POA: Diagnosis not present

## 2018-07-07 DIAGNOSIS — E782 Mixed hyperlipidemia: Secondary | ICD-10-CM

## 2018-07-07 DIAGNOSIS — Z79899 Other long term (current) drug therapy: Secondary | ICD-10-CM

## 2018-07-07 DIAGNOSIS — Z1389 Encounter for screening for other disorder: Secondary | ICD-10-CM | POA: Diagnosis not present

## 2018-07-07 DIAGNOSIS — Z0001 Encounter for general adult medical examination with abnormal findings: Secondary | ICD-10-CM

## 2018-07-07 MED ORDER — HPV QUADRIVALENT VACCINE IM SUSP: 0.5000 mL | Freq: Once | INTRAMUSCULAR | 0 refills | Status: AC

## 2018-07-07 NOTE — Patient Instructions (Addendum)
Preventive Care for Adults  A healthy lifestyle and preventive care can promote health and wellness. Preventive health guidelines for men include the following key practices:  A routine yearly physical is a good way to check with your health care provider about your health and preventative screening. It is a chance to share any concerns and updates on your health and to receive a thorough exam.  Visit your dentist for a routine exam and preventative care every 6 months. Brush your teeth twice a day and floss once a day. Good oral hygiene prevents tooth decay and gum disease.  The frequency of eye exams is based on your age, health, family medical history, use of contact lenses, and other factors. Follow your health care provider's recommendations for frequency of eye exams.  Eat a healthy diet. Foods such as vegetables, fruits, whole grains, low-fat dairy products, and lean protein foods contain the nutrients you need without too many calories. Decrease your intake of foods high in solid fats, added sugars, and salt. Eat the right amount of calories for you.Get information about a proper diet from your health care provider, if necessary.  Regular physical exercise is one of the most important things you can do for your health. Most adults should get at least 150 minutes of moderate-intensity exercise (any activity that increases your heart rate and causes you to sweat) each week. In addition, most adults need muscle-strengthening exercises on 2 or more days a week.  Maintain a healthy weight. The body mass index (BMI) is a screening tool to identify possible weight problems. It provides an estimate of body fat based on height and weight. Your health care provider can find your BMI and can help you achieve or maintain a healthy weight.For adults 20 years and older:  A BMI below 18.5 is considered underweight.  A BMI of 18.5 to 24.9 is normal.  A BMI of 25 to 29.9 is considered overweight.  A  BMI of 30 and above is considered obese.  Maintain normal blood lipids and cholesterol levels by exercising and minimizing your intake of saturated fat. Eat a balanced diet with plenty of fruit and vegetables. Blood tests for lipids and cholesterol should begin at age 20 and be repeated every 5 years. If your lipid or cholesterol levels are high, you are over 50, or you are at high risk for heart disease, you may need your cholesterol levels checked more frequently.Ongoing high lipid and cholesterol levels should be treated with medicines if diet and exercise are not working.  If you smoke, find out from your health care provider how to quit. If you do not use tobacco, do not start.  Lung cancer screening is recommended for adults aged 55-80 years who are at high risk for developing lung cancer because of a history of smoking. A yearly low-dose CT scan of the lungs is recommended for people who have at least a 30-pack-year history of smoking and are a current smoker or have quit within the past 15 years. A pack year of smoking is smoking an average of 1 pack of cigarettes a day for 1 year (for example: 1 pack a day for 30 years or 2 packs a day for 15 years). Yearly screening should continue until the smoker has stopped smoking for at least 15 years. Yearly screening should be stopped for people who develop a health problem that would prevent them from having lung cancer treatment.  If you choose to drink alcohol, do not have more   than 2 drinks per day. One drink is considered to be 12 ounces (355 mL) of beer, 5 ounces (148 mL) of wine, or 1.5 ounces (44 mL) of liquor.  High blood pressure causes heart disease and increases the risk of stroke. Your blood pressure should be checked. Ongoing high blood pressure should be treated with medicines, if weight loss and exercise are not effective.  If you are 38-65 years old, ask your health care provider if you should take aspirin to prevent heart  disease.  Diabetes screening involves taking a blood sample to check your fasting blood sugar level. Testing should be considered at a younger age or be carried out more frequently if you are overweight and have at least 1 risk factor for diabetes.  Colorectal cancer can be detected and often prevented. Most routine colorectal cancer screening begins at the age of 56 and continues through age 26. However, your health care provider may recommend screening at an earlier age if you have risk factors for colon cancer. On a yearly basis, your health care provider may provide home test kits to check for hidden blood in the stool. Use of a small camera at the end of a tube to directly examine the colon (sigmoidoscopy or colonoscopy) can detect the earliest forms of colorectal cancer. Talk to your health care provider about this at age 96, when routine screening begins. Direct exam of the colon should be repeated every 5-10 years through age 69, unless early forms of precancerous polyps or small growths are found.  Screening for abdominal aortic aneurysm (AAA)  are recommended for persons over age 25 who have history of hypertensionor who are current or former smokers.  Talk with your health care provider about prostate cancer screening.  Testicular cancer screening is recommended for adult males. Screening includes self-exam, a health care provider exam, and other screening tests. Consult with your health care provider about any symptoms you have or any concerns you have about testicular cancer.  Use sunscreen. Apply sunscreen liberally and repeatedly throughout the day. You should seek shade when your shadow is shorter than you. Protect yourself by wearing long sleeves, pants, a wide-brimmed hat, and sunglasses year round, whenever you are outdoors.  Once a month, do a whole-body skin exam, using a mirror to look at the skin on your back. Tell your health care provider about new moles, moles that have  irregular borders, moles that are larger than a pencil eraser, or moles that have changed in shape or color.  Stay current with required vaccines (immunizations).  Influenza vaccine. All adults should be immunized every year.  Tetanus, diphtheria, and acellular pertussis (Td, Tdap) vaccine. An adult who has not previously received Tdap or who does not know his vaccine status should receive 1 dose of Tdap. This initial dose should be followed by tetanus and diphtheria toxoids (Td) booster doses every 10 years. Adults with an unknown or incomplete history of completing a 3-dose immunization series with Td-containing vaccines should begin or complete a primary immunization series including a Tdap dose. Adults should receive a Td booster every 10 years.  Zoster vaccine. One dose is recommended for adults aged 26 years or older unless certain conditions are present.    Pneumococcal 13-valent conjugate (PCV13) vaccine. When indicated, a person who is uncertain of his immunization history and has no record of immunization should receive the PCV13 vaccine. An adult aged 52 years or older who has certain medical conditions and has not been previously immunized  should receive 1 dose of PCV13 vaccine. This PCV13 should be followed with a dose of pneumococcal polysaccharide (PPSV23) vaccine. The PPSV23 vaccine dose should be obtained at least 8 weeks after the dose of PCV13 vaccine. An adult aged 104 years or older who has certain medical conditions and previously received 1 or more doses of PPSV23 vaccine should receive 1 dose of PCV13. The PCV13 vaccine dose should be obtained 1 or more years after the last PPSV23 vaccine dose.    Pneumococcal polysaccharide (PPSV23) vaccine. When PCV13 is also indicated, PCV13 should be obtained first. All adults aged 71 years and older should be immunized. An adult younger than age 40 years who has certain medical conditions should be immunized. Any person who resides in a  nursing home or long-term care facility should be immunized. An adult smoker should be immunized. People with an immunocompromised condition and certain other conditions should receive both PCV13 and PPSV23 vaccines. People with human immunodeficiency virus (HIV) infection should be immunized as soon as possible after diagnosis. Immunization during chemotherapy or radiation therapy should be avoided. Routine use of PPSV23 vaccine is not recommended for American Indians, [place] Natives, or people younger than 65 years unless there are medical conditions that require PPSV23 vaccine. When indicated, people who have unknown immunization and have no record of immunization should receive PPSV23 vaccine. One-time revaccination 5 years after the first dose of PPSV23 is recommended for people aged 19-64 years who have chronic kidney failure, nephrotic syndrome, asplenia, or immunocompromised conditions. People who received 1-2 doses of PPSV23 before age 82 years should receive another dose of PPSV23 vaccine at age 75 years or later if at least 5 years have passed since the previous dose. Doses of PPSV23 are not needed for people immunized with PPSV23 at or after age 49 years.  Hepatitis A vaccine. Adults who wish to be protected from this disease, have certain high-risk conditions, work with hepatitis A-infected animals, work in hepatitis A research labs, or travel to or work in countries with a high rate of hepatitis A should be immunized. Adults who were previously unvaccinated and who anticipate close contact with an international adoptee during the first 60 days after arrival in the [place] from a country with a high rate of hepatitis A should be immunized.  Hepatitis B vaccine. Adults should be immunized if they wish to be protected from this disease, have certain high-risk conditions, may be exposed to blood or other infectious body fluids, are household contacts or sex partners of hepatitis B positive  people, are clients or workers in certain care facilities, or travel to or work in countries with a high rate of hepatitis B.  Preventive Service / Frequency  Ages 59 to 45  Blood pressure check.  Lipid and cholesterol check.  Hepatitis C blood test.** / For any individual with known risks for hepatitis C.  Skin self-exam. / Monthly.  Influenza vaccine. / Every year.  Tetanus, diphtheria, and acellular pertussis (Tdap, Td) vaccine.** / Consult your health care provider. 1 dose of Td every 10 years.  HPV vaccine. / 3 doses over 6 months, if 51 or younger.  Measles, mumps, rubella (MMR) vaccine.** / You need at least 1 dose of MMR if you were born in 1957 or later. You may also need a second dose.  Pneumococcal 13-valent conjugate (PCV13) vaccine.** / Consult your health care provider.  Pneumococcal polysaccharide (PPSV23) vaccine.** / 1 to 2 doses if you smoke cigarettes or if you have  certain conditions.  Meningococcal vaccine.** / 1 dose if you are age 65 to 56 years and a Market researcher living in a residence hall, or have one of several medical conditions. You may also need additional booster doses.  Hepatitis A vaccine.** / Consult your health care provider.  Hepatitis B vaccine.** / Consult your health care provider. +++++++++ Recommend Adult Low Dose Aspirin or  coated  Aspirin 81 mg daily  To reduce risk of Colon Cancer 20 %,  Skin Cancer 26 % ,  Melanoma 46%  and  Pancreatic cancer 60% ++++++++++++++++++ Vitamin D goal  is between 70-100.  Please make sure that you are taking your Vitamin D as directed.  It is very important as a natural anti-inflammatory  helping hair, skin, and nails, as well as reducing stroke and heart attack risk.  It helps your bones and helps with mood. It also decreases numerous cancer risks so please take it as directed.  Low Vit D is associated with a 200-300% higher risk for CANCER  and 200-300% higher risk for HEART    ATTACK  &  STROKE.   .....................................Marland Kitchen It is also associated with higher death rate at younger ages,  autoimmune diseases like Rheumatoid arthritis, Lupus, Multiple Sclerosis.    Also many other serious conditions, like depression, Alzheimer's Dementia, infertility, muscle aches, fatigue, fibromyalgia - just to name a few. +++++++++++++++++++++ Recommend the book "The END of DIETING" by Dr Excell Seltzer  & the book "The END of DIABETES " by Dr Excell Seltzer At [email or web address] - get book & Audio CD's    Being diabetic has a  300% increased risk for heart attack, stroke, cancer, and alzheimer- type vascular dementia. It is very important that you work harder with diet by avoiding all foods that are white. Avoid white rice (brown & wild rice is OK), white potatoes (sweetpotatoes in moderation is OK), White bread or wheat bread or anything made out of white flour like bagels, donuts, rolls, buns, biscuits, cakes, pastries, cookies, pizza crust, and pasta (made from white flour & egg whites) - vegetarian pasta or spinach or wheat pasta is OK. Multigrain breads like Arnold's or [employer], or multigrain sandwich thins or flatbreads.  Diet, exercise and weight loss can reverse and cure diabetes in the early stages.  Diet, exercise and weight loss is very important in the control and prevention of complications of diabetes which affects every system in your body, ie. Brain - dementia/stroke, eyes - glaucoma/blindness, heart - heart attack/heart failure, kidneys - dialysis, stomach - gastric paralysis, intestines - malabsorption, nerves - severe painful neuritis, circulation - gangrene & loss of a leg(s), and finally cancer and Alzheimers.    I recommend avoid fried & greasy foods,  sweets/candy, white rice (brown or wild rice or Quinoa is OK), white potatoes (sweet potatoes are OK) - anything made from white flour - bagels, doughnuts, rolls, buns, biscuits,white and wheat breads, pizza crust  and traditional pasta made of white flour & egg white(vegetarian pasta or spinach or wheat pasta is OK).  Multi-grain bread is OK - like multi-grain flat bread or sandwich thins. Avoid alcohol in excess. Exercise is also important.    Eat all the vegetables you want - avoid meat, especially red meat and dairy - especially cheese.  Cheese is the most concentrated form of trans-fats which is the worst thing to clog up our arteries. Veggie cheese is OK which can be found in the fresh produce section at [facility] or  [employer] or [employer]  +++++++++++++++++++ DASH Eating Plan  DASH stands for "Dietary Approaches to Stop Hypertension."   The DASH eating plan is a healthy eating plan that has been shown to reduce high blood pressure (hypertension). Additional health benefits may include reducing the risk of type 2 diabetes mellitus, heart disease, and stroke. The DASH eating plan may also help with weight loss. WHAT DO I NEED TO KNOW ABOUT THE DASH EATING PLAN? For the DASH eating plan, you will follow these general guidelines:  Choose foods with a percent daily value for sodium of less than 5% (as listed on the food label).  Use salt-free seasonings or herbs instead of table salt or sea salt.  Check with your health care provider or pharmacist before using salt substitutes.  Eat lower-sodium products, often labeled as "lower sodium" or "no salt added."  Eat fresh foods.  Eat more vegetables, fruits, and low-fat dairy products.  Choose whole grains. Look for the word "whole" as the first word in the ingredient list.  Choose fish   Limit sweets, desserts, sugars, and sugary drinks.  Choose heart-healthy fats.  Eat veggie cheese   Eat more home-cooked food and less restaurant, buffet, and fast food.  Limit fried foods.  Cook foods using methods other than frying.  Limit canned vegetables. If you do use them, rinse them well to decrease the sodium.  When eating at a  restaurant, ask that your food be prepared with less salt, or no salt if possible.                      WHAT FOODS CAN I EAT? Read Dr Fara Olden Fuhrman's books on The End of Dieting & The End of Diabetes  Grains Whole grain or whole wheat bread. Brown rice. Whole grain or whole wheat pasta. Quinoa, bulgur, and whole grain cereals. Low-sodium cereals. Corn or whole wheat flour tortillas. Whole grain cornbread. Whole grain crackers. Low-sodium crackers.  Vegetables Fresh or frozen vegetables (raw, steamed, roasted, or grilled). Low-sodium or reduced-sodium tomato and vegetable juices. Low-sodium or reduced-sodium tomato sauce and paste. Low-sodium or reduced-sodium canned vegetables.   Fruits All fresh, canned (in natural juice), or frozen fruits.  Protein Products  All fish and seafood.  Dried beans, peas, or lentils. Unsalted nuts and seeds. Unsalted canned beans.  Dairy Low-fat dairy products, such as skim or 1% milk, 2% or reduced-fat cheeses, low-fat ricotta or cottage cheese, or plain low-fat yogurt. Low-sodium or reduced-sodium cheeses.  Fats and Oils Tub margarines without trans fats. Light or reduced-fat mayonnaise and salad dressings (reduced sodium). Avocado. Safflower, olive, or canola oils. Natural peanut or almond butter.  Other Unsalted popcorn and pretzels. The items listed above may not be a complete list of recommended foods or beverages. Contact your dietitian for more options.  +++++++++++++++++++  WHAT FOODS ARE NOT RECOMMENDED? Grains/ White flour or wheat flour White bread. White pasta. White rice. Refined cornbread. Bagels and croissants. Crackers that contain trans fat.  Vegetables  Creamed or fried vegetables. Vegetables in a . Regular canned vegetables. Regular canned tomato sauce and paste. Regular tomato and vegetable juices.  Fruits Dried fruits. Canned fruit in light or heavy syrup. Fruit juice.  Meat and Other Protein Products Meat in general - RED  meat & White meat.  Fatty cuts of meat. Ribs, chicken wings, all processed meats as bacon, sausage, bologna, salami, fatback, hot dogs, bratwurst and packaged luncheon meats.  Dairy Whole or 2% milk, cream,  half-and-half, and cream cheese. Whole-fat or sweetened yogurt. Full-fat cheeses or blue cheese. Non-dairy creamers and whipped toppings. Processed cheese, cheese spreads, or cheese curds.  Condiments Onion and garlic salt, seasoned salt, table salt, and sea salt. Canned and packaged gravies. Worcestershire sauce. Tartar sauce. Barbecue sauce. Teriyaki sauce. Soy sauce, including reduced sodium. Steak sauce. Fish sauce. Oyster sauce. Cocktail sauce. Horseradish. Ketchup and mustard. Meat flavorings and tenderizers. Bouillon cubes. Hot sauce. Tabasco sauce. Marinades. Taco seasonings. Relishes.  Fats and Oils Butter, stick margarine, lard, shortening and bacon fat. Coconut, palm kernel, or palm oils. Regular salad dressings.  Pickles and olives. Salted popcorn and pretzels.  The items listed above may not be a complete list of foods and beverages to avoid.    Achilles Tendinitis Achilles tendinitis is inflammation of the tough, cord-like band that attaches the lower leg muscles to the heel bone (Achilles tendon). This is usually caused by overusing the tendon and the ankle joint. Achilles tendinitis usually gets better over time with treatment and caring for yourself at home. It can take weeks or months to heal completely. What are the causes? This condition may be caused by:  A sudden increase in exercise or activity, such as running.  Doing the same exercises or activities (such as jumping) over and over.  Not warming up calf muscles before exercising.  Exercising in shoes that are worn out or not made for exercise.  Having arthritis or a bone growth (spur) on the back of the heel bone. This can rub against the tendon and hurt it.  Age-related wear and tear. Tendons become less  flexible with age and more likely to be injured.  What are the signs or symptoms? Common symptoms of this condition include:  Pain in the Achilles tendon or in the back of the leg, just above the heel. The pain usually gets worse with exercise.  Stiffness or soreness in the back of the leg, especially in the morning.  Swelling of the skin over the Achilles tendon.  Thickening of the tendon.  Bone spurs at the bottom of the Achilles tendon, near the heel.  Trouble standing on tiptoe.  How is this diagnosed? This condition is diagnosed based on your symptoms and a physical exam. You may have tests, including:  X-rays.  MRI.  How is this treated? The goal of treatment is to relieve symptoms and help your injury heal. Treatment may include:  Decreasing or stopping activities that caused the tendinitis. This may mean switching to low-impact exercises like biking or swimming.  Icing the injured area.  Doing physical therapy, including strengthening and stretching exercises.  NSAIDs to help relieve pain and swelling.  Using supportive shoes, wraps, heel lifts, or a walking boot (air cast).  Surgery. This may be done if your symptoms do not improve after 6 months.  Using high-energy shock wave impulses to stimulate the healing process (extracorporeal shock wave therapy). This is rare.  Injection of medicines to help relieve inflammation (corticosteroids). This is rare.  Follow these instructions at home: If you have an air cast:  Wear the cast as told by your health care provider. Remove it only as told by your health care provider.  Loosen the cast if your toes tingle, become numb, or turn cold and blue. Activity  Gradually return to your normal activities once your health care provider approves. Do not do activities that cause pain. ? Consider doing low-impact exercises, like cycling or swimming.  If you have an air  cast, ask your health care provider when it is safe  for you to drive.  If physical therapy was prescribed, do exercises as told by your health care provider or physical therapist. Managing pain, stiffness, and swelling  Raise (elevate) your foot above the level of your heart while you are sitting or lying down.  Move your toes often to avoid stiffness and to lessen swelling.  If directed, put ice on the injured area: ? Put ice in a plastic bag. ? Place a towel between your skin and the bag. ? Leave the ice on for 20 minutes, 2-3 times a day General instructions  If directed, wrap your foot with an elastic bandage or other wrap. This can help keep your tendon from moving too much while it heals. Your health care provider will show you how to wrap your foot correctly.  Wear supportive shoes or heel lifts only as told by your health care provider.  Take over-the-counter and prescription medicines only as told by your health care provider.  Keep all follow-up visits as told by your health care provider. This is important. Contact a health care provider if:  You have symptoms that gets worse.  You have pain that does not get better with medicine.  You develop new, unexplained symptoms.  You develop warmth and swelling in your foot.  You have a fever. Get help right away if:  You have a sudden popping sound or sensation in your Achilles tendon followed by severe pain.  You cannot move your toes or foot.  You cannot put any weight on your foot. Summary  Achilles tendinitis is inflammation of the tough, cord-like band that attaches the lower leg muscles to the heel bone (Achilles tendon).  This condition is usually caused by overusing the tendon and the ankle joint. It can also be caused by arthritis or normal aging.  The most common symptoms of this condition include pain, swelling, or stiffness in the Achilles tendon or in the back of the leg.  This condition is usually treated with rest, NSAIDs, and physical therapy.

## 2018-07-07 NOTE — Progress Notes (Signed)
[facility]   Lucky Cowboy, M.D.     Dyanne Carrel. Steffanie Dunn, P.A.-C Judd Gaudier, DNP [facility]                [address]                [place], [place]. [address] Telephone [phone] Telefax [phone] Annual  Screening/Preventative Visit  & Comprehensive Evaluation & Examination     This very nice 32 y.o.male presents for a Screening /Preventative Visit & comprehensive evaluation and management of multiple medical co-morbidities.  Patient is evaluated expectantly for  Elevated BP, hx/o elevated LDL and screened for Prediabetes and Vitamin D Deficiency. Patient endorse possible exposure to STD's & requests screening.      Patient's BP has been controlled at home, but today's BP is sl elevated -  138/84. Patient denies any cardiac symptoms as chest pain, palpitations, shortness of breath, dizziness or ankle swelling. BP Readings from Last 3 Encounters:  07/07/18 138/84  05/26/18 102/64  03/11/17 118/62       Patient's hyperlipidemia is near controlled with diet. Last lipids were near goal: Lab Results  Component Value Date   CHOL 175 03/11/2017   HDL 52 03/11/2017   LDLCALC 104 (H) 03/11/2017   TRIG 96 03/11/2017   CHOLHDL 3.4 03/11/2017      Patient is screened proactively for  prediabetes and patient denies reactive hypoglycemic symptoms, visual blurring, diabetic polys or paresthesias. Last A1c was  Normal & at goal: Lab Results  Component Value Date   HGBA1C 4.5 03/11/2017       Finally, patient has history of severe Vitamin D Deficiency ("20"/2016 and "11"/2017)  and last vitamin D was extremely low : Lab Results  Component Value Date   VD25OH 9 (L) 03/11/2017   On No Meds  No Known Allergies   Past Medical History:  Diagnosis Date  . Alcohol abuse 09/14/12   Patient does not desire to quit at this time  . Hyperlipidemia   . Vitamin D deficiency    Health Maintenance  Topic Date Due   . INFLUENZA VACCINE  07/09/2018  . TETANUS/TDAP  02/01/2019  . HIV Screening  Completed   Immunization History  Administered Date(s) Administered  . HPV Quadrivalent 02/27/2010  . PPD Test 01/24/2014, 03/08/2015  . Tdap 02/01/2009   Past Surgical History:  Procedure Laterality Date  . APPENDECTOMY     32 year old   Family History  Problem Relation Age of Onset  . Diabetes Mother   . Hypertension Mother   . Alcohol abuse Father   . Cancer Maternal Grandmother        BREAST  . Stroke Maternal Grandfather   . Cancer Paternal Grandmother        lung   Social History   Socioeconomic History  . Marital status: Single  . Years of education: Did not finish Jr college at [employer] is working in [employer].   Tobacco Use  . Smoking status: Current Some Day Smoker  . Smokeless tobacco: Never Used  Substance and Sexual Activity  . Alcohol use: Yes    Alcohol/week: 0.0 oz  . Drug use: Yes    Types: Marijuana  . Sexual activity: Not on file  Lifestyle  . Physical activity:    Days per week: Not on file    Minutes per session: Not on file  . Stress: Not on file  ROS Constitutional: Denies fever, chills, weight loss/gain, headaches, insomnia,  night sweats or change in appetite. Does c/o fatigue. Eyes: Denies redness, blurred vision, diplopia, discharge, itchy or watery eyes.  ENT: Denies discharge, congestion, post nasal drip, epistaxis, sore throat, earache, hearing loss, dental pain, Tinnitus, Vertigo, Sinus pain or snoring.  Cardio: Denies chest pain, palpitations, irregular heartbeat, syncope, dyspnea, diaphoresis, orthopnea, PND, claudication or edema Respiratory: denies cough, dyspnea, DOE, pleurisy, hoarseness, laryngitis or wheezing.  Gastrointestinal: Denies dysphagia, heartburn, reflux, water brash, pain, cramps, nausea, vomiting, bloating, diarrhea, constipation, hematemesis, melena, hematochezia, jaundice or hemorrhoids Genitourinary: Denies dysuria, frequency,  urgency, nocturia, hesitancy, discharge, hematuria or flank pain Musculoskeletal: Denies arthralgia, myalgia, stiffness, Jt. Swelling, pain, limp or strain/sprain. Denies Falls. Skin: Denies puritis, rash, hives, warts, acne, eczema or change in skin lesion Neuro: No weakness, tremor, incoordination, spasms, paresthesia or pain Psychiatric: Denies confusion, memory loss or sensory loss. Denies Depression. Endocrine: Denies change in weight, skin, hair change, nocturia, and paresthesia, diabetic polys, visual blurring or hyper / hypo glycemic episodes.  Heme/Lymph: No excessive bleeding, bruising or enlarged lymph nodes.  Physical Exam  BP 138/84   Pulse 76   Temp (!) 97.3 F (36.3 C)   Resp 18   Ht 5' 9.5" (1.765 m)   Wt 225 lb 3.2 oz (102.2 kg)   BMI 32.78 kg/m   General Appearance: Well nourished and well groomed and in no apparent distress.  Eyes: PERRLA, EOMs, conjunctiva no swelling or erythema, normal fundi and vessels. Sinuses: No frontal/maxillary tenderness ENT/Mouth: EACs patent / TMs  nl. Nares clear without erythema, swelling, mucoid exudates. Oral hygiene is good. No erythema, swelling, or exudate. Tongue normal, non-obstructing. Tonsils not swollen or erythematous. Hearing normal.  Neck: Supple, thyroid not palpable. No bruits, nodes or JVD. Respiratory: Respiratory effort normal.  BS equal and clear bilateral without rales, rhonci, wheezing or stridor. Cardio: Heart sounds are normal with regular rate and rhythm and no murmurs, rubs or gallops. Peripheral pulses are normal and equal bilaterally without edema. No aortic or femoral bruits. Chest: symmetric with normal excursions and percussion.  Abdomen: Soft, with Nl bowel sounds. Nontender, no guarding, rebound, hernias, masses, or organomegaly.  Lymphatics: Non tender without lymphadenopathy.  Genitourinary: No hernias.Testes nl. Deferred. Musculoskeletal: Full ROM all peripheral extremities, joint stability, 5/5  strength, and normal gait. Skin: Warm and dry without rashes, lesions, cyanosis, clubbing or  ecchymosis.  Neuro: Cranial nerves intact, reflexes equal bilaterally. Normal muscle tone, no cerebellar symptoms. Sensation intact.  Pysch: Alert and oriented X 3 with normal affect, insight and judgment appropriate.   Assessment and Plan  1. Annual Preventative/Screening Exam   2. Elevated BP without diagnosis of hypertension  - EKG 12-Lead - Urinalysis, Routine w reflex microscopic - Microalbumin / creatinine urine ratio - CBC with Differential/Platelet - COMPLETE METABOLIC PANEL WITH GFR - Magnesium - TSH  3. Hyperlipidemia, mixed  - EKG 12-Lead - Lipid panel - TSH  4. Abnormal glucose  - EKG 12-Lead - Hemoglobin A1c - Insulin, random  5. Vitamin D deficiency  - VITAMIN D 25 Hydroxyl  6. Testosterone deficiency  - Testosterone  7. Smoker  - EKG 12-Lead  8. FH: hypertension  - EKG 12-Lead  9. Screening for ischemic heart disease  - EKG 12-Lead  10. Screening for colorectal cancer  - POC Hemoccult Bld/Stl   11. Fatigue  - Iron,Total/Total Iron Binding Cap - Vitamin B12 - Testosterone - TSH  12. Medication management  - Urinalysis, Routine w reflex  microscopic - Microalbumin / creatinine urine ratio - CBC with Differential/Platelet - COMPLETE METABOLIC PANEL WITH GFR - Magnesium - Lipid panel - TSH - Hemoglobin A1c - Insulin, random - VITAMIN D 25 Hydroxyl        Patient was counseled in prudent diet, weight control to achieve/maintain BMI less than 25, BP monitoring, regular exercise and medications as discussed.  Discussed med effects and SE's. Routine screening labs and tests as requested with regular follow-up as recommended. Over 40 minutes of exam, counseling, chart review and high complex critical decision making was performed

## 2018-07-08 LAB — URINALYSIS, ROUTINE W REFLEX MICROSCOPIC
Bilirubin Urine: NEGATIVE
Glucose, UA: NEGATIVE
HGB URINE DIPSTICK: NEGATIVE
Ketones, ur: NEGATIVE
LEUKOCYTES UA: NEGATIVE
NITRITE: NEGATIVE
PH: 6 (ref 5.0–8.0)
Protein, ur: NEGATIVE
SPECIFIC GRAVITY, URINE: 1.022 (ref 1.001–1.03)

## 2018-07-08 LAB — CBC WITH DIFFERENTIAL/PLATELET
BASOS ABS: 40 {cells}/uL (ref 0–200)
Basophils Relative: 0.6 %
Eosinophils Absolute: 119 cells/uL (ref 15–500)
Eosinophils Relative: 1.8 %
HEMATOCRIT: 45.2 % (ref 38.5–50.0)
Hemoglobin: 15.8 g/dL (ref 13.2–17.1)
Lymphs Abs: 1386 cells/uL (ref 850–3900)
MCH: 33.4 pg — AB (ref 27.0–33.0)
MCHC: 35 g/dL (ref 32.0–36.0)
MCV: 95.6 fL (ref 80.0–100.0)
MPV: 10.5 fL (ref 7.5–12.5)
Monocytes Relative: 7.3 %
NEUTROS PCT: 69.3 %
Neutro Abs: 4574 cells/uL (ref 1500–7800)
Platelets: 205 10*3/uL (ref 140–400)
RBC: 4.73 10*6/uL (ref 4.20–5.80)
RDW: 12.1 % (ref 11.0–15.0)
TOTAL LYMPHOCYTE: 21 %
WBC: 6.6 10*3/uL (ref 3.8–10.8)
WBCMIX: 482 {cells}/uL (ref 200–950)

## 2018-07-08 LAB — COMPLETE METABOLIC PANEL WITH GFR
AG RATIO: 2 (calc) (ref 1.0–2.5)
ALBUMIN MSPROF: 4.5 g/dL (ref 3.6–5.1)
ALT: 53 U/L — AB (ref 9–46)
AST: 29 U/L (ref 10–40)
Alkaline phosphatase (APISO): 77 U/L (ref 40–115)
BUN: 10 mg/dL (ref 7–25)
CALCIUM: 9.5 mg/dL (ref 8.6–10.3)
CO2: 28 mmol/L (ref 20–32)
Chloride: 103 mmol/L (ref 98–110)
Creat: 0.84 mg/dL (ref 0.60–1.35)
GFR, EST AFRICAN AMERICAN: 132 mL/min/{1.73_m2} (ref >=60)
GFR, EST NON AFRICAN AMERICAN: 114 mL/min/{1.73_m2} (ref >=60)
GLOBULIN: 2.3 g/dL (ref 1.9–3.7)
Glucose, Bld: 105 mg/dL — ABNORMAL HIGH (ref 65–99)
Potassium: 4.6 mmol/L (ref 3.5–5.3)
SODIUM: 139 mmol/L (ref 135–146)
TOTAL PROTEIN: 6.8 g/dL (ref 6.1–8.1)
Total Bilirubin: 0.8 mg/dL (ref 0.2–1.2)

## 2018-07-08 LAB — LIPID PANEL
CHOL/HDL RATIO: 3.9 (calc) (ref <5.0)
Cholesterol: 205 mg/dL — ABNORMAL HIGH (ref <200)
HDL: 53 mg/dL (ref >40)
LDL Cholesterol (Calc): 130 mg/dL (calc) — ABNORMAL HIGH
NON-HDL CHOLESTEROL (CALC): 152 mg/dL — AB (ref <130)
Triglycerides: 117 mg/dL (ref <150)

## 2018-07-08 LAB — MICROALBUMIN / CREATININE URINE RATIO
CREATININE, URINE: 140 mg/dL (ref 20–320)
MICROALB UR: 0.8 mg/dL
MICROALB/CREAT RATIO: 6 ug/mg{creat} (ref <30)

## 2018-07-08 LAB — HEMOGLOBIN A1C
Hgb A1c MFr Bld: 4.9 % of total Hgb (ref <5.7)
Mean Plasma Glucose: 94 (calc)
eAG (mmol/L): 5.2 (calc)

## 2018-07-08 LAB — IRON, TOTAL/TOTAL IRON BINDING CAP
%SAT: 35 % (calc) (ref 20–48)
IRON: 139 ug/dL (ref 50–180)
TIBC: 399 ug/dL (ref 250–425)

## 2018-07-08 LAB — VITAMIN D 25 HYDROXY (VIT D DEFICIENCY, FRACTURES): Vit D, 25-Hydroxy: 12 ng/mL — ABNORMAL LOW (ref 30–100)

## 2018-07-08 LAB — MAGNESIUM: Magnesium: 2.1 mg/dL (ref 1.5–2.5)

## 2018-07-08 LAB — INSULIN, RANDOM: Insulin: 5.1 u[IU]/mL (ref 2.0–19.6)

## 2018-07-08 LAB — RPR: RPR Ser Ql: NONREACTIVE

## 2018-07-08 LAB — C. TRACHOMATIS/N. GONORRHOEAE RNA
C. TRACHOMATIS RNA, TMA: NOT DETECTED
N. GONORRHOEAE RNA, TMA: NOT DETECTED

## 2018-07-08 LAB — TESTOSTERONE: Testosterone: 571 ng/dL (ref 250–827)

## 2018-07-08 LAB — TSH: TSH: 1.37 m[IU]/L (ref 0.40–4.50)

## 2018-07-08 LAB — HIV ANTIBODY (ROUTINE TESTING W REFLEX): HIV 1&2 Ab, 4th Generation: NONREACTIVE

## 2018-07-08 LAB — VITAMIN B12: Vitamin B-12: 510 pg/mL (ref 200–1100)

## 2018-09-07 ENCOUNTER — Ambulatory Visit (INDEPENDENT_AMBULATORY_CARE_PROVIDER_SITE_OTHER): Payer: BLUE CROSS/BLUE SHIELD | Admitting: *Deleted

## 2018-09-07 DIAGNOSIS — Z23 Encounter for immunization: Secondary | ICD-10-CM | POA: Diagnosis not present

## 2019-01-07 ENCOUNTER — Ambulatory Visit (INDEPENDENT_AMBULATORY_CARE_PROVIDER_SITE_OTHER): Payer: BLUE CROSS/BLUE SHIELD

## 2019-01-07 VITALS — BP 120/82 | HR 86 | Temp 98.9°F | Ht 69.5 in | Wt 233.4 lb

## 2019-01-07 DIAGNOSIS — Z23 Encounter for immunization: Secondary | ICD-10-CM | POA: Diagnosis not present

## 2019-01-07 NOTE — Progress Notes (Signed)
Pt reports for 3rd injection of Gardasil which was given in left arm Vitals entered into Epic

## 2019-07-06 ENCOUNTER — Encounter: Payer: Self-pay | Admitting: Internal Medicine

## 2019-07-29 ENCOUNTER — Encounter: Payer: Self-pay | Admitting: Internal Medicine

## 2019-09-26 ENCOUNTER — Encounter: Payer: Self-pay | Admitting: Internal Medicine

## 2019-09-26 NOTE — Patient Instructions (Signed)
Vit D  & Vit C 1,000 mg   are recommended to help protect  against the Covid-19 and other Corona viruses.    Also it's recommended  to take  Zinc 50 mg  to help  protect against the Covid-19   and best place to get  is also on [email or web address]  and don't pay more than 6-8 cents /pill !  ================================= Coronavirus (COVID-19) Are you at risk?  Are you at risk for the Coronavirus (COVID-19)?  To be considered HIGH RISK for Coronavirus (COVID-19), you have to meet the following criteria:  . Traveled to [place], [place], [place], [place] or [place]; or in the [place] to [place], [place], [place]  . or [place]; and have fever, cough, and shortness of breath within the last 2 weeks of travel OR . Been in close contact with a person diagnosed with COVID-19 within the last 2 weeks and have  . fever, cough,and shortness of breath .  . IF YOU DO NOT MEET THESE CRITERIA, YOU ARE CONSIDERED LOW RISK FOR COVID-19.  What to do if you are HIGH RISK for COVID-19?  Marland Kitchen If you are having a medical emergency, call 911. . Seek medical care right away. Before you go to a doctor's office, urgent care or emergency department, .  call ahead and tell them about your recent travel, contact with someone diagnosed with COVID-19  .  and your symptoms.  . You should receive instructions from your physician's office regarding next steps of care.  . When you arrive at healthcare provider, tell the healthcare staff immediately you have returned from  . visiting [place], [place], [place], [place] or [place]; or traveled in the [place] to [place], [place],  . [place] or [place] in the last two weeks or you have been in close contact with a person diagnosed with  . COVID-19 in the last 2 weeks.   . Tell the health care staff about your symptoms: fever, cough and shortness of breath. . After you have been seen by a medical provider, you will be either: o Tested for (COVID-19)  and discharged home on quarantine except to seek medical care if  o symptoms worsen, and asked to  - Stay home and avoid contact with others until you get your results (4-5 days)  - Avoid travel on public transportation if possible (such as bus, train, or airplane) or o Sent to the Emergency Department by EMS for evaluation, COVID-19 testing  and  o possible admission depending on your condition and test results.  What to do if you are LOW RISK for COVID-19?  Reduce your risk of any infection by using the same precautions used for avoiding the common cold or flu:  Marland Kitchen Wash your hands often with soap and warm water for at least 20 seconds.  If soap and water are not readily available,  . use an alcohol-based hand sanitizer with at least 60% alcohol.  . If coughing or sneezing, cover your mouth and nose by coughing or sneezing into the elbow areas of your shirt or coat, .  into a tissue or into your sleeve (not your hands). . Avoid shaking hands with others and consider head nods or verbal greetings only. . Avoid touching your eyes, nose, or mouth with unwashed hands.  . Avoid close contact with people who are sick. . Avoid places or events with large numbers of people in one location, like concerts or sporting events. Marland Kitchen  Carefully consider travel plans you have or are making. . If you are planning any travel outside or inside the [place], visit the CDC's Travelers' Health webpage for the latest health notices. . If you have some symptoms but not all symptoms, continue to monitor at home and seek medical attention  . if your symptoms worsen. . If you are having a medical emergency, call 911. >>>>>>>>>>>>>>>>>>>>>>>> Preventive Care for Adults  A healthy lifestyle and preventive care can promote health and wellness. Preventive health guidelines for men include the following key practices:  A routine yearly physical is a good way to check with your health care provider about your health and  preventative screening. It is a chance to share any concerns and updates on your health and to receive a thorough exam.  Visit your dentist for a routine exam and preventative care every 6 months. Brush your teeth twice a day and floss once a day. Good oral hygiene prevents tooth decay and gum disease.  The frequency of eye exams is based on your age, health, family medical history, use of contact lenses, and other factors. Follow your health care provider's recommendations for frequency of eye exams.  Eat a healthy diet. Foods such as vegetables, fruits, whole grains, low-fat dairy products, and lean protein foods contain the nutrients you need without too many calories. Decrease your intake of foods high in solid fats, added sugars, and salt. Eat the right amount of calories for you. Get information about a proper diet from your health care provider, if necessary.  Regular physical exercise is one of the most important things you can do for your health. Most adults should get at least 150 minutes of moderate-intensity exercise (any activity that increases your heart rate and causes you to sweat) each week. In addition, most adults need muscle-strengthening exercises on 2 or more days a week.  Maintain a healthy weight. The body mass index (BMI) is a screening tool to identify possible weight problems. It provides an estimate of body fat based on height and weight. Your health care provider can find your BMI and can help you achieve or maintain a healthy weight. For adults 20 years and older:  A BMI below 18.5 is considered underweight.  A BMI of 18.5 to 24.9 is normal.  A BMI of 25 to 29.9 is considered overweight.  A BMI of 30 and above is considered obese.  Maintain normal blood lipids and cholesterol levels by exercising and minimizing your intake of saturated fat. Eat a balanced diet with plenty of fruit and vegetables. Blood tests for lipids and cholesterol should begin at age 62 and be  repeated every 5 years. If your lipid or cholesterol levels are high, you are over 50, or you are at high risk for heart disease, you may need your cholesterol levels checked more frequently. Ongoing high lipid and cholesterol levels should be treated with medicines if diet and exercise are not working.  If you smoke, find out from your health care provider how to quit. If you do not use tobacco, do not start.  Lung cancer screening is recommended for adults aged 75-80 years who are at high risk for developing lung cancer because of a history of smoking. A yearly low-dose CT scan of the lungs is recommended for people who have at least a 30-pack-year history of smoking and are a current smoker or have quit within the past 15 years. A pack year of smoking is smoking an average of 1  pack of cigarettes a day for 1 year (for example: 1 pack a day for 30 years or 2 packs a day for 15 years). Yearly screening should continue until the smoker has stopped smoking for at least 15 years. Yearly screening should be stopped for people who develop a health problem that would prevent them from having lung cancer treatment.  If you choose to drink alcohol, do not have more than 2 drinks per day. One drink is considered to be 12 ounces (355 mL) of beer, 5 ounces (148 mL) of wine, or 1.5 ounces (44 mL) of liquor.  High blood pressure causes heart disease and increases the risk of stroke. Your blood pressure should be checked. Ongoing high blood pressure should be treated with medicines, if weight loss and exercise are not effective.  If you are 45-79 years old, ask your health care provider if you should take aspirin to prevent heart disease.  Diabetes screening involves taking a blood sample to check your fasting blood sugar level. Testing should be considered at a younger age or be carried out more frequently if you are overweight and have at least 1 risk factor for diabetes.  Colorectal cancer can be detected and  often prevented. Most routine colorectal cancer screening begins at the age of 50 and continues through age 75. However, your health care provider may recommend screening at an earlier age if you have risk factors for colon cancer. On a yearly basis, your health care provider may provide home test kits to check for hidden blood in the stool. Use of a small camera at the end of a tube to directly examine the colon (sigmoidoscopy or colonoscopy) can detect the earliest forms of colorectal cancer. Talk to your health care provider about this at age 50, when routine screening begins. Direct exam of the colon should be repeated every 5-10 years through age 75, unless early forms of precancerous polyps or small growths are found.  Screening for abdominal aortic aneurysm (AAA)  are recommended for persons over age 50 who have history of hypertensionor who are current or former smokers.  Talk with your health care provider about prostate cancer screening.  Testicular cancer screening is recommended for adult males. Screening includes self-exam, a health care provider exam, and other screening tests. Consult with your health care provider about any symptoms you have or any concerns you have about testicular cancer.  Use sunscreen. Apply sunscreen liberally and repeatedly throughout the day. You should seek shade when your shadow is shorter than you. Protect yourself by wearing long sleeves, pants, a wide-brimmed hat, and sunglasses year round, whenever you are outdoors.  Once a month, do a whole-body skin exam, using a mirror to look at the skin on your back. Tell your health care provider about new moles, moles that have irregular borders, moles that are larger than a pencil eraser, or moles that have changed in shape or color.  Stay current with required vaccines (immunizations).  Influenza vaccine. All adults should be immunized every year.  Tetanus, diphtheria, and acellular pertussis (Td, Tdap) vaccine.  An adult who has not previously received Tdap or who does not know his vaccine status should receive 1 dose of Tdap. This initial dose should be followed by tetanus and diphtheria toxoids (Td) booster doses every 10 years. Adults with an unknown or incomplete history of completing a 3-dose immunization series with Td-containing vaccines should begin or complete a primary immunization series including a Tdap dose. Adults should receive a   Td booster every 10 years.  Zoster vaccine. One dose is recommended for adults aged 69 years or older unless certain conditions are present.    Pneumococcal 13-valent conjugate (PCV13) vaccine. When indicated, a person who is uncertain of his immunization history and has no record of immunization should receive the PCV13 vaccine. An adult aged 11 years or older who has certain medical conditions and has not been previously immunized should receive 1 dose of PCV13 vaccine. This PCV13 should be followed with a dose of pneumococcal polysaccharide (PPSV23) vaccine. The PPSV23 vaccine dose should be obtained at least 8 weeks after the dose of PCV13 vaccine. An adult aged 41 years or older who has certain medical conditions and previously received 1 or more doses of PPSV23 vaccine should receive 1 dose of PCV13. The PCV13 vaccine dose should be obtained 1 or more years after the last PPSV23 vaccine dose.    Pneumococcal polysaccharide (PPSV23) vaccine. When PCV13 is also indicated, PCV13 should be obtained first. All adults aged 90 years and older should be immunized. An adult younger than age 67 years who has certain medical conditions should be immunized. Any person who resides in a nursing home or long-term care facility should be immunized. An adult smoker should be immunized. People with an immunocompromised condition and certain other conditions should receive both PCV13 and PPSV23 vaccines. People with human immunodeficiency virus (HIV) infection should be immunized as  soon as possible after diagnosis. Immunization during chemotherapy or radiation therapy should be avoided. Routine use of PPSV23 vaccine is not recommended for American Indians, [place] Natives, or people younger than 65 years unless there are medical conditions that require PPSV23 vaccine. When indicated, people who have unknown immunization and have no record of immunization should receive PPSV23 vaccine. One-time revaccination 5 years after the first dose of PPSV23 is recommended for people aged 19-64 years who have chronic kidney failure, nephrotic syndrome, asplenia, or immunocompromised conditions. People who received 1-2 doses of PPSV23 before age 57 years should receive another dose of PPSV23 vaccine at age 29 years or later if at least 5 years have passed since the previous dose. Doses of PPSV23 are not needed for people immunized with PPSV23 at or after age 48 years.  Hepatitis A vaccine. Adults who wish to be protected from this disease, have certain high-risk conditions, work with hepatitis A-infected animals, work in hepatitis A research labs, or travel to or work in countries with a high rate of hepatitis A should be immunized. Adults who were previously unvaccinated and who anticipate close contact with an international adoptee during the first 60 days after arrival in the [place] from a country with a high rate of hepatitis A should be immunized.  Hepatitis B vaccine. Adults should be immunized if they wish to be protected from this disease, have certain high-risk conditions, may be exposed to blood or other infectious body fluids, are household contacts or sex partners of hepatitis B positive people, are clients or workers in certain care facilities, or travel to or work in countries with a high rate of hepatitis B.  Preventive Service / Frequency  Ages 40 to 19  Blood pressure check.  Lipid and cholesterol check.  Hepatitis C blood test.** / For any individual with known risks  for hepatitis C.  Skin self-exam. / Monthly.  Influenza vaccine. / Every year.  Tetanus, diphtheria, and acellular pertussis (Tdap, Td) vaccine.** / Consult your health care provider. 1 dose of Td every 10 years.  HPV vaccine. / 3 doses over 6 months, if 43 or younger.  Measles, mumps, rubella (MMR) vaccine.** / You need at least 1 dose of MMR if you were born in 1957 or later. You may also need a second dose.  Pneumococcal 13-valent conjugate (PCV13) vaccine.** / Consult your health care provider.  Pneumococcal polysaccharide (PPSV23) vaccine.** / 1 to 2 doses if you smoke cigarettes or if you have certain conditions.  Meningococcal vaccine.** / 1 dose if you are age 35 to 96 years and a Market researcher living in a residence hall, or have one of several medical conditions. You may also need additional booster doses.  Hepatitis A vaccine.** / Consult your health care provider.  Hepatitis B vaccine.** / Consult your health care provider. +++++++++ Recommend Adult Low Dose Aspirin or  coated  Aspirin 81 mg daily  To reduce risk of Colon Cancer 40 %,  Skin Cancer 26 % ,  Melanoma 46%  and  Pancreatic cancer 60% ++++++++++++++++++ Vitamin D goal  is between 70-100.  Please make sure that you are taking your Vitamin D as directed.  It is very important as a natural anti-inflammatory  helping hair, skin, and nails, as well as reducing stroke and heart attack risk.  It helps your bones and helps with mood. It also decreases numerous cancer risks so please take it as directed.  Low Vit D is associated with a 200-300% higher risk for CANCER  and 200-300% higher risk for HEART   ATTACK  &  STROKE.   .....................................Marland Kitchen It is also associated with higher death rate at younger ages,  autoimmune diseases like Rheumatoid arthritis, Lupus, Multiple Sclerosis.    Also many other serious conditions, like depression, Alzheimer's Dementia, infertility, muscle  aches, fatigue, fibromyalgia - just to name a few. +++++++++++++++++++++ Recommend the book "The END of DIETING" by Dr Excell Seltzer  & the book "The END of DIABETES " by Dr Excell Seltzer At [facility].com - get book & Audio CD's    Being diabetic has a  300% increased risk for heart attack, stroke, cancer, and alzheimer- type vascular dementia. It is very important that you work harder with diet by avoiding all foods that are white. Avoid white rice (brown & wild rice is OK), white potatoes (sweetpotatoes in moderation is OK), White bread or wheat bread or anything made out of white flour like bagels, donuts, rolls, buns, biscuits, cakes, pastries, cookies, pizza crust, and pasta (made from white flour & egg whites) - vegetarian pasta or spinach or wheat pasta is OK. Multigrain breads like Arnold's or [employer], or multigrain sandwich thins or flatbreads.  Diet, exercise and weight loss can reverse and cure diabetes in the early stages.  Diet, exercise and weight loss is very important in the control and prevention of complications of diabetes which affects every system in your body, ie. Brain - dementia/stroke, eyes - glaucoma/blindness, heart - heart attack/heart failure, kidneys - dialysis, stomach - gastric paralysis, intestines - malabsorption, nerves - severe painful neuritis, circulation - gangrene & loss of a leg(s), and finally cancer and Alzheimers.    I recommend avoid fried & greasy foods,  sweets/candy, white rice (brown or wild rice or Quinoa is OK), white potatoes (sweet potatoes are OK) - anything made from white flour - bagels, doughnuts, rolls, buns, biscuits,white and wheat breads, pizza crust and traditional pasta made of white flour & egg white(vegetarian pasta or spinach or wheat pasta is OK).  Multi-grain bread is OK -  like multi-grain flat bread or sandwich thins. Avoid alcohol in excess. Exercise is also important.    Eat all the vegetables you want - avoid meat, especially red  meat and dairy - especially cheese.  Cheese is the most concentrated form of trans-fats which is the worst thing to clog up our arteries. Veggie cheese is OK which can be found in the fresh produce section at [employer] or [employer] or [employer]  +++++++++++++++++++ DASH Eating Plan  DASH stands for "Dietary Approaches to Stop Hypertension."   The DASH eating plan is a healthy eating plan that has been shown to reduce high blood pressure (hypertension). Additional health benefits may include reducing the risk of type 2 diabetes mellitus, heart disease, and stroke. The DASH eating plan may also help with weight loss. WHAT DO I NEED TO KNOW ABOUT THE DASH EATING PLAN? For the DASH eating plan, you will follow these general guidelines:  Choose foods with a percent daily value for sodium of less than 5% (as listed on the food label).  Use salt-free seasonings or herbs instead of table salt or sea salt.  Check with your health care provider or pharmacist before using salt substitutes.  Eat lower-sodium products, often labeled as "lower sodium" or "no salt added."  Eat fresh foods.  Eat more vegetables, fruits, and low-fat dairy products.  Choose whole grains. Look for the word "whole" as the first word in the ingredient list.  Choose fish   Limit sweets, desserts, sugars, and sugary drinks.  Choose heart-healthy fats.  Eat veggie cheese   Eat more home-cooked food and less restaurant, buffet, and fast food.  Limit fried foods.  Cook foods using methods other than frying.  Limit canned vegetables. If you do use them, rinse them well to decrease the sodium.  When eating at a restaurant, ask that your food be prepared with less salt, or no salt if possible.                      WHAT FOODS CAN I EAT? Read Dr Fara Olden Fuhrman's books on The End of Dieting & The End of Diabetes  Grains Whole grain or whole wheat bread. Brown rice. Whole grain or whole wheat pasta. Quinoa, bulgur,  and whole grain cereals. Low-sodium cereals. Corn or whole wheat flour tortillas. Whole grain cornbread. Whole grain crackers. Low-sodium crackers.  Vegetables Fresh or frozen vegetables (raw, steamed, roasted, or grilled). Low-sodium or reduced-sodium tomato and vegetable juices. Low-sodium or reduced-sodium tomato sauce and paste. Low-sodium or reduced-sodium canned vegetables.   Fruits All fresh, canned (in natural juice), or frozen fruits.  Protein Products  All fish and seafood.  Dried beans, peas, or lentils. Unsalted nuts and seeds. Unsalted canned beans.  Dairy Low-fat dairy products, such as skim or 1% milk, 2% or reduced-fat cheeses, low-fat ricotta or cottage cheese, or plain low-fat yogurt. Low-sodium or reduced-sodium cheeses.  Fats and Oils Tub margarines without trans fats. Light or reduced-fat mayonnaise and salad dressings (reduced sodium). Avocado. Safflower, olive, or canola oils. Natural peanut or almond butter.  Other Unsalted popcorn and pretzels. The items listed above may not be a complete list of recommended foods or beverages. Contact your dietitian for more options.  +++++++++++++++++++  WHAT FOODS ARE NOT RECOMMENDED? Grains/ White flour or wheat flour White bread. White pasta. White rice. Refined cornbread. Bagels and croissants. Crackers that contain trans fat.  Vegetables  Creamed or fried vegetables. Vegetables in a . Regular canned vegetables.  Regular canned tomato sauce and paste. Regular tomato and vegetable juices.  Fruits Dried fruits. Canned fruit in light or heavy syrup. Fruit juice.  Meat and Other Protein Products Meat in general - RED meat & White meat.  Fatty cuts of meat. Ribs, chicken wings, all processed meats as bacon, sausage, bologna, salami, fatback, hot dogs, bratwurst and packaged luncheon meats.  Dairy Whole or 2% milk, cream, half-and-half, and cream cheese. Whole-fat or sweetened yogurt. Full-fat cheeses or blue cheese.  Non-dairy creamers and whipped toppings. Processed cheese, cheese spreads, or cheese curds.  Condiments Onion and garlic salt, seasoned salt, table salt, and sea salt. Canned and packaged gravies. Worcestershire sauce. Tartar sauce. Barbecue sauce. Teriyaki sauce. Soy sauce, including reduced sodium. Steak sauce. Fish sauce. Oyster sauce. Cocktail sauce. Horseradish. Ketchup and mustard. Meat flavorings and tenderizers. Bouillon cubes. Hot sauce. Tabasco sauce. Marinades. Taco seasonings. Relishes.  Fats and Oils Butter, stick margarine, lard, shortening and bacon fat. Coconut, palm kernel, or palm oils. Regular salad dressings.  Pickles and olives. Salted popcorn and pretzels.  The items listed above may not be a complete list of foods and beverages to avoid.

## 2019-09-26 NOTE — Progress Notes (Signed)
       N  O        S  H  O  W                                                                                                                                                                This very nice 33 y.o.male presents for a Screening /Preventative Visit & comprehensive evaluation and management of multiple medical co-morbidities.  Patient presents for screening for  Elevated /abnormal  BP, glucose, lipids  and Vitamin D Deficiency.     Patient had a bordrlin eelevated BP. Patient's BP has been controlled at home.  Today's  . Patient denies any cardiac symptoms as chest pain, palpitations, shortness of breath, dizziness or ankle swelling.     Patient's hyperlipidemia is controlled with diet and medications. Patient denies myalgias or other medication SE's. Last lipids were not at goal:  Lab Results  Component Value Date   CHOL 205 (H) 07/07/2018   HDL 53 07/07/2018   LDLCALC 130 (H) 07/07/2018   TRIG 117 07/07/2018   CHOLHDL 3.9 07/07/2018      Patient has hx/o prediabetes since    and patient denies reactive hypoglycemic symptoms, visual blurring, diabetic polys or paresthesias. Last A1c was Normal :  Lab Results  Component Value Date   HGBA1C 4.9 07/07/2018       Finally, patient has history of Vitamin D Deficiency ("20" / 2016 , 11"/ 2017 and then "9"  / 2018)  and last vitamin D was still ery low :  Lab Results  Component Value Date   VD25OH 12 (L) 07/07/2018

## 2019-09-27 ENCOUNTER — Ambulatory Visit (INDEPENDENT_AMBULATORY_CARE_PROVIDER_SITE_OTHER): Payer: Self-pay | Admitting: Internal Medicine

## 2019-09-27 DIAGNOSIS — R7309 Other abnormal glucose: Secondary | ICD-10-CM | POA: Insufficient documentation

## 2019-09-27 DIAGNOSIS — Z5329 Procedure and treatment not carried out because of patient's decision for other reasons: Secondary | ICD-10-CM

## 2019-09-27 DIAGNOSIS — R030 Elevated blood-pressure reading, without diagnosis of hypertension: Secondary | ICD-10-CM | POA: Insufficient documentation

## 2020-04-24 ENCOUNTER — Ambulatory Visit (INDEPENDENT_AMBULATORY_CARE_PROVIDER_SITE_OTHER): Payer: Self-pay | Admitting: Adult Health Nurse Practitioner

## 2020-04-24 ENCOUNTER — Other Ambulatory Visit: Payer: Self-pay

## 2020-04-24 ENCOUNTER — Encounter: Payer: Self-pay | Admitting: Adult Health Nurse Practitioner

## 2020-04-24 VITALS — BP 122/80 | HR 84 | Temp 97.9°F | Ht 69.0 in | Wt 225.0 lb

## 2020-04-24 DIAGNOSIS — M5417 Radiculopathy, lumbosacral region: Secondary | ICD-10-CM

## 2020-04-24 DIAGNOSIS — R030 Elevated blood-pressure reading, without diagnosis of hypertension: Secondary | ICD-10-CM

## 2020-04-24 DIAGNOSIS — S39012D Strain of muscle, fascia and tendon of lower back, subsequent encounter: Secondary | ICD-10-CM

## 2020-04-24 MED ORDER — PREDNISONE 10 MG (21) PO TBPK: ORAL_TABLET | Freq: Every day | ORAL | 0 refills | Status: DC

## 2020-04-24 MED ORDER — TIZANIDINE HCL 4 MG PO TABS: 4.0000 mg | ORAL_TABLET | Freq: Three times a day (TID) | ORAL | 1 refills | Status: DC

## 2020-04-24 NOTE — Progress Notes (Signed)
Assessment and Plan:  Louis Martinez was seen today for acute visit and back pain.  Diagnoses and all orders for this visit: Patient is self pay at this time.  Strain of lumbar region, subsequent encounter Lumbosacral radiculopathy -     predniSONE (STERAPRED UNI-PAK 21 TAB) 10 MG (21) TBPK tablet; Take by mouth daily. Follow directions on Taper pack -     tiZANidine (ZANAFLEX) 4 MG tablet; Take 1 tablet (4 mg total) by mouth 3 (three) times daily. Continue ice to area 17min, 4 times a day Discussed stretching/strengthening once pain has improved.  Elevated BP without diagnosis of hypertension Controlled today  Contact office with any new or worsening symptoms.   Patient due for routine physical.  Will have insurance coverage in 3 months and will schedule accordingly.  Follow up in 4 months.   Further disposition pending results of labs. Discussed med's effects and SE's.   Over 30 minutes of face to face interview, exam, counseling, chart review, and critical decision making was performed.   Future Appointments  Date Time Provider [place]  08/29/2020  9:30 AM Garnet Sierras, NP [facility] None    ------------------------------------------------------------------------------------------------------------------   HPI 34 y.o.male presents for evaluation of back pain that started last Wednesday, 5 days ago.  Reports he started coughing.  He was sitting in the car.  Reports he felt a pop and his right lower back started hurting, so severely that he had to pull over over his car and get out to move around.  Reports he has shooting pain on the right side that shoots down to back and now the top of his leg is also sore with an ache down to his knee.  He reports that when he is laying down the pain is worse with position changes it is 9/10 pain.  It is improved with laying down but getting up aggravates it.  He reports the pain is 6/10 when up on his feet and now changing the way  walks.  He has taken ibuprofen 600mg  or 800mg  3-4 times a day.  He went to an urgent care immediatly after it happened.  He received and injection, that has helped some.  He is not sure what he received.  He works in a [employer] and is on his feet all day, 8hour shifts.  He reports he wears supportive shoes.  He Reports 04/10/20 SARS-COV2 vaccination series complete.    Past Medical History:  Diagnosis Date  . Alcohol abuse 09/14/12   Patient does not desire to quit at this time  . Hyperlipidemia   . Vitamin D deficiency      No Known Allergies  No current outpatient medications on file prior to visit.   No current facility-administered medications on file prior to visit.    ROS: all negative except above.   Physical Exam:  BP 122/80   Pulse 84   Temp 97.9 F (36.6 C)   Ht 5\' 9"  (1.753 m)   Wt 225 lb (102.1 kg)   SpO2 98%   BMI 33.23 kg/m   General Appearance: Well nourished, in no apparent distress. Neck: Supple, thyroid normal.  Respiratory: Respiratory effort normal, BS equal bilaterally without rales, rhonchi, wheezing or stridor.  Cardio: RRR with no MRGs. Brisk peripheral pulses without edema.  Abdomen: Soft, obese,+ BS.  Non tender, no guarding, rebound, hernias, masses. Lymphatics: Non tender without lymphadenopathy.  Musculoskeletal: Full ROM, 5/5 strength, abnormal gait with initiation.  Straight leg test, positive. Tenderness noted right SI  joint, right lumbar deep palpation. Skin: Warm, dry without rashes, lesions, ecchymosis.  Neuro: Cranial nerves intact. Normal muscle tone, no cerebellar symptoms. Sensation intact.  Psych: Awake and oriented X 3, normal affect, Insight and Judgment appropriate.     Elder Negus, NP 12:38 PM [facility]

## 2020-04-24 NOTE — Patient Instructions (Signed)
Spondylolysis Rehab Ask your health care provider which exercises are safe for you. Do exercises exactly as told by your health care provider and adjust them as directed. It is normal to feel mild stretching, pulling, tightness, or discomfort as you do these exercises. Stop right away if you feel sudden pain or your pain gets worse. Do not begin these exercises until told by your health care provider. Stretching and range-of-motion exercises These exercises warm up your muscles and joints and improve the movement and flexibility of your hips and your back. These exercises may also help to relieve pain, numbness, and tingling. Single knee to chest  1. Lie on your back on a firm surface with both legs straight. 2. Bend one of your knees. Use your hands to move your knee up toward your chest until you feel a gentle stretch in your lower back and buttock. ? Hold your leg in this position by holding on to the front of your knee. ? Keep your other leg as straight as possible. 3. Hold for __________ seconds. 4. Slowly return to the starting position. 5. Repeat this exercise with your other leg. Repeat __________ times. Complete this exercise __________ times a day. Hamstring stretch, supine  1. Lie on your back (supine position). 2. Loop a belt or towel over the ball of your left / right foot. The ball of your foot is on the walking surface, right under your toes. 3. Straighten your left / right knee and slowly pull on the belt or towel to raise your leg. Raise your leg until you feel a gentle stretch behind your knee or thigh (hamstring). ? Do not let your left / right knee bend while you do this. ? Keep your other leg flat on the floor. 4. Hold this position for __________ seconds. 5. Slowly return your leg to the starting position. 6. Repeat this exercise with your other leg. Repeat __________ times. Complete this exercise __________ times a day. Strengthening exercises These exercises build  strength and endurance in your back. Endurance is the ability to use your muscles for a long time, even after they get tired. Pelvic tilt This exercise strengthens the muscles that lie deep in the abdomen. 1. Lie on your back on a firm bed or the floor. Bend your knees and keep your feet flat. 2. Tense your abdominal muscles. Tip your pelvis up toward the ceiling and flatten your lower back into the floor. ? To help with this exercise, you may place a small towel under your lower back and try to push your back into the towel. 3. Hold for __________ seconds. 4. Let your muscles relax completely before you repeat this exercise. Repeat __________ times. Complete this exercise __________ times a day. Abdominal crunch  1. Lie on your back on a firm surface. Bend your knees and keep your feet flat. Cross your arms over your chest. 2. Tuck your chin down toward your chest, without bending your neck. 3. Use your abdominal muscles to lift your upper body off the ground, straight up into the air. ? Try to lift yourself until your shoulder blades are off the ground. You may need to work up to this. ? Keep your lower back on the ground while you crunch upward. ? Do not hold your breath. 4. Slowly lower yourself down. Keep your abdominal muscles tense until you are back to the starting position. Repeat __________ times. Complete this exercise __________ times a day. Alternating arm and leg raises  1.  Get on your hands and knees on a firm surface. If you are on a hard floor, you may want to use padding, such as an exercise mat, to cushion your knees. 2. Line up your arms and legs. Your hands should be directly below your shoulders, and your knees should be directly below your hips. 3. Lift your left leg behind you. At the same time, raise your right arm and straighten it in front of you. ? Do not lift your leg higher than your hip. ? Do not lift your arm higher than your shoulder. ? Keep your abdominal  and back muscles tight. ? Keep your hips facing the ground. ? Do not arch your back. ? Keep your balance carefully, and do not hold your breath. 4. Hold for __________ seconds. 5. Slowly return to the starting position. 6. Repeat with your right leg and your left arm. Repeat __________ times. Complete this exercise __________ times a day. Posture and body mechanics Good posture and healthy body mechanics can help to relieve stress in your body's tissues and joints. Body mechanics refers to the movements and positions of your body while you do your daily activities. Posture is part of body mechanics. Good posture means:  Your spine is in its natural S-curve position (neutral).  Your shoulders are pulled back slightly.  Your head is not tipped forward. Follow these guidelines to improve your posture and body mechanics in your everyday activities. Standing   When standing, keep your spine neutral and your feet about hip width apart. Keep a slight bend in your knees. Your ears, shoulders, and hips should line up.  When you do a task in which you stand in one place for a long time, place one foot up on a stable object that is 2-4 inches (5-10 cm) high, such as a footstool. This helps keep your spine neutral. Sitting   When sitting, keep your spine neutral and keep your feet flat on the floor. Use a footrest, if necessary, and keep your thighs parallel to the floor. Avoid rounding your shoulders, and avoid tilting your head forward.  When working at a desk or a computer, keep your desk at a height where your hands are slightly lower than your elbows. Slide your chair under your desk so you are close enough to maintain good posture.  When working at a computer, place your monitor at a height where you are looking straight ahead and you do not have to tilt your head forward or downward to look at the screen. Resting   When lying down and resting, avoid positions that are most painful for  you.  If you have pain with activities such as sitting, bending, stooping, or squatting (flexion-based activities), lie in a position in which your body does not bend very much. For example, avoid curling up on your side with your arms and knees near your chest (fetal position).  If you have pain with activities such as standing for a long time or reaching with your arms (extension-based activities), lie with your spine in a neutral position and bend your knees slightly. Try the following positions: ? Lying on your side with a pillow between your knees. ? Lying on your back with a pillow under your knees. Lifting   When lifting objects, keep your feet at least shoulder width apart and tighten your abdominal muscles.  Bend your knees and hips and keep your spine neutral. It is important to lift using the strength of your legs,  not your back. Do not lock your knees straight out.  Always ask for help to lift heavy or awkward objects. This information is not intended to replace advice given to you by your health care provider. Make sure you discuss any questions you have with your health care provider. Document Revised: 03/22/2019 Document Reviewed: 03/22/2019 Elsevier Patient Education  [place].

## 2020-05-03 ENCOUNTER — Ambulatory Visit: Payer: Self-pay | Admitting: Adult Health

## 2020-05-03 ENCOUNTER — Other Ambulatory Visit: Payer: Self-pay

## 2020-05-03 ENCOUNTER — Encounter: Payer: Self-pay | Admitting: Adult Health

## 2020-05-03 VITALS — BP 110/64 | HR 106 | Temp 97.5°F | Ht 69.0 in | Wt 225.2 lb

## 2020-05-03 DIAGNOSIS — M62838 Other muscle spasm: Secondary | ICD-10-CM

## 2020-05-03 DIAGNOSIS — M5416 Radiculopathy, lumbar region: Secondary | ICD-10-CM

## 2020-05-03 MED ORDER — PREDNISONE 20 MG PO TABS: ORAL_TABLET | ORAL | 0 refills | Status: DC

## 2020-05-03 NOTE — Patient Instructions (Addendum)
  Look up anterior tibial stretches  Get foam roller and do massaging   Let me know if getting worse not better, numbness is spreading, getting weakness in other areas of the leg

## 2020-05-03 NOTE — Progress Notes (Signed)
Assessment and Plan:  Louis Martinez was seen today for acute visit.  Diagnoses and all orders for this visit:  Muscle spasm of right leg ? Subacute right lumbar radiculopathy Exam today suggests R lower leg muscular etiology primarily, improving some over last 2 days Back pain resolved, ? Some subacute radicular component, if dorsiflexion weakness is persistent would consider lumbar MRI, will repeat shorter steroid today  He is currently uninsured; would likely benefit from PT but defer; reviewed ankle ROM exercises focusing on increasing dorsiflexion, given information for lower leg stretches, ongoing resources Try moist heat prior to stretches, - hold for ~30 sec then rest, repeat several times for each area ? Weakness seems mostly r/t tight muscles, however if persistent or progressive consider radicular etiology component will pursue MRI once he is insured Follow up if worsening weakness, loss of control, increasing numbness/tingling -     predniSONE (DELTASONE) 20 MG tablet; 2 tablets daily for 3 days, 1 tablet daily for 4 days.  Further disposition pending results of labs. Discussed med's effects and SE's.   Over 15 minutes of exam, counseling, chart review, and critical decision making was performed.   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 presents for follow up due to concerning RLE lower leg cramping and ankle dorsiflexion weakness persistent since Monday (3 days). He is uninsured at this time and cash pay, has new job and will have insurance in  60 days or so.   Reports intermittent self limited lower back pain episodes for last 2 years, will have intermittent bilateral radicular symptoms  Had another back flare around 5/12, went to UC and had steroid shot which helped some but didn't resolve, then was seen 5/17 here, had radicular sx of RLE and was prescribed prednisone taper which he completed 5 days ago, was sx free for 2 days, the woke up 3 days ago with cramp in R calf and lateral thigh, tightness and sense of difficulty to dorsiflex R ankle, some anterolateral tibial tightness. Reports some tingling in R great toe not extending up foot or leg. He reports this has improved some over the last 2 days, hasn't tried much of anything but concerned due to limping and working on his feet r/t job.   Past Medical History:  Diagnosis Date  . Alcohol abuse 09/14/12   Patient does not desire to quit at this time  . Hyperlipidemia   . Vitamin D deficiency      No Known Allergies  Current Outpatient Medications on File Prior to Visit  Medication Sig  . tiZANidine (ZANAFLEX) 4 MG tablet Take 1 tablet (4 mg total) by mouth 3 (three) times daily. (Patient not taking: Reported on 05/03/2020)   No current facility-administered medications on file prior to visit.    ROS: all negative except above.   Physical Exam:  BP 110/64   Pulse (!) 106   Temp (!) 97.5 F (36.4 C)   Ht 5\' 9"  (1.753 m)   Wt 225 lb 3.2 oz (102.2 kg)   SpO2 96%  BMI 33.26 kg/m   General Appearance: Well nourished, obese young male, in no apparent distress. Eyes: conjunctiva no swelling or erythema ENT/Mouth: Mask in place, Hearing normal.  Neck: Supple Respiratory: Respiratory effort normal, BS equal bilaterally without rales, rhonchi, wheezing or stridor.  Cardio: RRR with no MRGs. Brisk peripheral pulses without edema.  Abdomen: Soft, + BS.  Non tender, no guarding, rebound, hernias, masses. Lymphatics: Non tender without lymphadenopathy.  Musculoskeletal:  Full ROM lumbar, hips, knees, ankles, 5/5 strength excepting limited dorsiflexion R <L, tightness anterior shin, can hold against resistance, mildly antalgic gait. No lumbar pain, midline tenderness, neg straight leg raise.  Skin: Warm, dry without rashes, lesions, ecchymosis.  Neuro: Normal muscle tone, no cerebellar symptoms. Sensation intact.  Psych: Awake and oriented X 3, normal affect, Insight and Judgment appropriate.     Dan Maker, NP 12:41 PM [facility]

## 2020-05-16 ENCOUNTER — Encounter: Payer: Self-pay | Admitting: Adult Health Nurse Practitioner

## 2020-05-16 ENCOUNTER — Ambulatory Visit (INDEPENDENT_AMBULATORY_CARE_PROVIDER_SITE_OTHER): Payer: Self-pay | Admitting: Adult Health Nurse Practitioner

## 2020-05-16 ENCOUNTER — Other Ambulatory Visit: Payer: Self-pay

## 2020-05-16 VITALS — BP 120/80 | HR 82 | Temp 97.7°F | Wt 223.0 lb

## 2020-05-16 DIAGNOSIS — R030 Elevated blood-pressure reading, without diagnosis of hypertension: Secondary | ICD-10-CM

## 2020-05-16 DIAGNOSIS — R7309 Other abnormal glucose: Secondary | ICD-10-CM

## 2020-05-16 DIAGNOSIS — R197 Diarrhea, unspecified: Secondary | ICD-10-CM

## 2020-05-16 DIAGNOSIS — R202 Paresthesia of skin: Secondary | ICD-10-CM

## 2020-05-16 DIAGNOSIS — S39012D Strain of muscle, fascia and tendon of lower back, subsequent encounter: Secondary | ICD-10-CM

## 2020-05-16 DIAGNOSIS — Z6832 Body mass index (BMI) 32.0-32.9, adult: Secondary | ICD-10-CM

## 2020-05-16 NOTE — Progress Notes (Signed)
Assessment and Plan:  Diagnoses and all orders for this visit:  Paresthesia of right foot Discussed dietary modifications. Improved with decrease ETOH intake STOP eating so much sugar and fried foods Decrease soda intake Increase activity Contact office with any new or worsening symptoms  Abnormal glucose BMI 32.0-32.9 Discussed dietary and exercise modifications  Elevated BP without diagnosis of hypertension Controlled today Will continue to monitor  Strain of lumbar region, subsequent encounter Improved Discussed involvement in paresthesias?   Further disposition pending results of labs. Discussed med's effects and SE's.   Over 30 minutes of face to face exam, counseling, chart review, and critical decision making was performed.   Future Appointments  Date Time Provider Department [facility]  08/29/2020  9:30 AM Elder Negus, NP [facility] None    ------------------------------------------------------------------------------------------------------------------   HPI 34 y.o.male presents for evaluation of right great toe.  He reports he had a cramp in his right calf.  He reports he gests cramps in both legs in the mornings so he didn;t think much about it.   He reports that the toe feels tight or pressure in it with some pins and needles that is intermittent.  He reports after his shift he tends to feel it more.  He reports this is not preventing him from working or his ADL's, he can just tell it is different. He repots he has never had anything like this before.   Denies any lesion, erythema, trauma, falls, edema or pain to right great toe. Having diarrhea 5-7 a day.  He has stopped drinking ETOH which has helped.  He would drink half gallon of whiskey a week. He reports he maybe drinks two bottles of water a day.  He mostly drinks soda about 6 a day or more.  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   Past Medical History:  Diagnosis Date  . Alcohol abuse 09/14/12   Patient does not desire to quit at this time  . Hyperlipidemia   . Vitamin D deficiency      No Known Allergies  Current Outpatient Medications on File Prior to Visit  Medication Sig  . tiZANidine (ZANAFLEX) 4 MG tablet Take 1 tablet (4 mg total) by mouth 3 (three) times daily.   No current facility-administered medications on file prior to visit.    ROS: all negative except above.   Physical Exam:  BP 120/80   Pulse 82   Temp 97.7 F (36.5 C)   Wt 223 lb (101.2 kg)   SpO2 97%   BMI 32.93 kg/m   General Appearance: Well nourished, in no apparent distress. Eyes: PERRLA, EOMs, conjunctiva no swelling or erythema ENT: Ext aud canals clear, TMs without erythema, bulging.  Hearing normal.  Neck: Supple, thyroid normal.  Respiratory: Respiratory effort normal, BS equal bilaterally without rales, rhonchi, wheezing or stridor.  Cardio: RRR with no MRGs. Brisk peripheral pulses without edema.  Abdomen: Soft obese, + BS.  Non tender, no guarding, rebound, hernias, masses. Lymphatics: Non tender without lymphadenopathy.  Musculoskeletal: Full ROM, 5/5 strength, normal gait.  Skin: Warm, dry without rashes, lesions, ecchymosis.  Neuro: Cranial nerves intact. Normal muscle tone, no cerebellar symptoms. Sensation intact.  Psych: Awake and oriented X 3, normal affect, Insight and Judgment appropriate.     Elder Negus, NP 1:55 PM [facility]

## 2020-05-16 NOTE — Patient Instructions (Addendum)
   Change some of the food that you eat.  Stop eating all of the sugar.  This could me contributing to your toe tingling.  Start decreasing the number of sodas each day.  Start by reducing to 4 sodas a day for couple weeks then reduce again.  Increase your water intake.   Increasing activity, by walking will help.   Please contact office with any new or worsening symptoms.  Increase in pain numbness or tingling, weakness in your leg.   You can try Magnesium supplement for your leg cramps.  Take one tablet every night, OR one tablet every other night.  Monitor your diarrhea, if it makes it worse them stop.   You can get this at any pharmacy.  Try Metamucil, this is a powder you mix with 8oz of water.  This can help to bulk your stool.   Aim for 7+ servings of fruits and vegetables daily   65+ fluid ounces of water for healthy kidneys   Limit animal fats in diet for cholesterol and heart health - choose grass fed whenever available   Aim for low stress - take time to unwind and care for your mental health   Aim for 150 min of moderate intensity exercise weekly for heart health, and weights twice weekly for bone health   Aim for 7-9 hours of sleep daily          When it comes to diets, agreement about the perfect plan isn't easy to find, even among the experts. Experts at the [facility] [employer] developed an idea known as the Healthy Eating Plate. Just imagine a plate divided into logical, healthy portions.   The emphasis is on diet quality:   Load up on vegetables and fruits - one-half of your plate: Aim for color and variety, and remember that white potatoes are high starch and should be consumed in moderation.     Go for whole grains - one-quarter of your plate: Whole wheat, barley, wheat berries, quinoa, oats, brown rice, and foods made with them. If you want pasta, go with whole wheat pasta as serving sized noted on package.   Protein power - one-quarter of  your plate: Fish, chicken, beans, and nuts are all healthy, versatile protein sources. Limit red meat.   The diet, however, does go beyond the plate, offering a few other suggestions.   Use healthy plant oils, such as olive, canola, soy, corn, sunflower and peanut. Check the labels, and avoid partially hydrogenated oil, which have unhealthy trans fats.  This will help your cholesterol and heart health.   If you're thirsty, drink water.  Aim for 80 oz of water a day.  Coffee and tea are ok in moderation, but skip sugary drinks (juices, sodas, sweet tea) and limit milk and dairy products to one or two daily servings.   The type of carbohydrate in the diet is more important than the amount. Some sources of carbohydrates, such as vegetables, fruits, whole grains, and beans--are healthier than others.  If you are tech-savvy, check out https://www.[facility].org/ for tips for replacing current unhealthy food choices, eating healthy on a budget, recipes and more.   Finally, stay active! Walking is an activity, involve family and friends.  It is FREE and maintains and or improves your health.

## 2020-08-09 ENCOUNTER — Encounter: Payer: BLUE CROSS/BLUE SHIELD | Admitting: Internal Medicine

## 2020-08-29 ENCOUNTER — Encounter: Payer: Self-pay | Admitting: Adult Health Nurse Practitioner

## 2020-08-29 ENCOUNTER — Ambulatory Visit: Payer: No Typology Code available for payment source | Admitting: Adult Health Nurse Practitioner

## 2020-08-29 ENCOUNTER — Other Ambulatory Visit: Payer: Self-pay

## 2020-08-29 VITALS — BP 120/78 | HR 75 | Temp 97.3°F | Ht 69.0 in | Wt 223.8 lb

## 2020-08-29 DIAGNOSIS — Z23 Encounter for immunization: Secondary | ICD-10-CM | POA: Diagnosis not present

## 2020-08-29 DIAGNOSIS — Z79899 Other long term (current) drug therapy: Secondary | ICD-10-CM

## 2020-08-29 DIAGNOSIS — Z6832 Body mass index (BMI) 32.0-32.9, adult: Secondary | ICD-10-CM

## 2020-08-29 DIAGNOSIS — F1010 Alcohol abuse, uncomplicated: Secondary | ICD-10-CM

## 2020-08-29 DIAGNOSIS — R030 Elevated blood-pressure reading, without diagnosis of hypertension: Secondary | ICD-10-CM

## 2020-08-29 DIAGNOSIS — M5417 Radiculopathy, lumbosacral region: Secondary | ICD-10-CM

## 2020-08-29 DIAGNOSIS — R7309 Other abnormal glucose: Secondary | ICD-10-CM

## 2020-08-29 DIAGNOSIS — R197 Diarrhea, unspecified: Secondary | ICD-10-CM

## 2020-08-29 DIAGNOSIS — R202 Paresthesia of skin: Secondary | ICD-10-CM

## 2020-08-29 DIAGNOSIS — E782 Mixed hyperlipidemia: Secondary | ICD-10-CM

## 2020-08-29 DIAGNOSIS — Z0000 Encounter for general adult medical examination without abnormal findings: Secondary | ICD-10-CM | POA: Diagnosis not present

## 2020-08-29 DIAGNOSIS — E559 Vitamin D deficiency, unspecified: Secondary | ICD-10-CM

## 2020-08-29 NOTE — Progress Notes (Signed)
3 Month Follow Up   Assessment and Plan:   Louis Martinez was seen today for follow-up.  Diagnoses and all orders for this visit:  Elevated BP without diagnosis of hypertension Continue current medications: Monitor blood pressure at home; call if consistently over 130/80 Continue DASH diet.   Reminder to go to the ER if any CP, SOB, nausea, dizziness, severe HA, changes vision/speech, left arm numbness and tingling and jaw pain.  -     CBC with Differential/Platelet -     COMPLETE METABOLIC PANEL WITH GFR -     TSH  Abnormal glucose Discussed dietary and exercise modifications -     Hemoglobin A1c  BMI 32.0-32.9,adult Discussed dietary and exercise modifications  Lumbosacral radiculopathy Paresthesia of right foot Improved Discussed increasing physical activity / stretching Has cyclobenzaprine PRN  Alcohol abuse Discussed use and moderation Decreasing will help with overall health and weight loss  Hyperlipidemia, mixed No medications at this time Discussed dietary and exercise modifications Low fat diet -     Lipid panel  Vitamin D deficiency No supplementation at this time -     VITAMIN D 25 Hydroxy (Vit-D Deficiency, Fractures)  Diarrhea, unspecified type Discussed lifestyle change and diet modification Decrease ETOH & Caffeine intake Add OTC Metamucil daily  Medication management Continued  Need for Tdap vaccination -     Tdap vaccine greater than or equal to 7yo IM  Encounter for annual physical exam Yearly   Continue diet and meds as discussed. Further disposition pending results of labs. Discussed med's effects and SE's.  Patient agrees with plan of care and opportunity to ask questions/voice concerns. Over 30 minutes of face to face interview, chart review, exam, counseling, and critical decision making was performed.   Future Appointments  Date Time Provider Department Center  03/13/2021  9:00 AM Elder Negus, NP [facility] None     ----------------------------------------------------------------------------------------------------------------------  HPI Louis Martinez  presents for complete physical and follow up on HTN, HLD, abnormal glucose with history of pre-diabetes, weight and vitamin D deficiency.   He reports overall he is doing well today.  He denies any health concerns.  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  He is employed at a [employer].    He admits that he does not eat healthy and does consume a lot of fried / greasy foods.  He reports that he also drinks anywhere from 4-6 drink a night.  He also reports smoking marijuana on occasion.  He does not smoke tobacco.  BMI is Body mass index is 33.05 kg/m., he has not been working on diet and exercise. Wt Readings from Last 3 Encounters:  08/29/20 223 lb 12.8 oz (101.5 kg)  05/16/20 223 lb (101.2 kg)  05/03/20 225 lb 3.2 oz (102.2 kg)     His blood pressure has been controlled at home, today their BP is BP: 120/78  He does workout. He denies any cardiac symptoms, chest pains, palpitations, shortness of breath, dizziness or lower extremity edema.      He is not on cholesterol medication   His cholesterol is not at goal. The cholesterol last visit was:   Lab Results  Component Value Date   CHOL 181 08/29/2020   HDL 49 08/29/2020   LDLCALC 99 08/29/2020   TRIG 214 (H) 08/29/2020   CHOLHDL 3.7 08/29/2020    He has not been working on diet and exercise for prediabetes, and  denies increased appetite, nausea, paresthesia of the feet, polydipsia, polyuria, visual disturbances, vomiting and weight loss. Last A1C in the office was:  Lab Results  Component Value Date   HGBA1C 4.8 08/29/2020     Patient does not have history of CKD.  Their last GFR was:  Lab Results  Component Value Date   GFRNONAA 120 08/29/2020   Lab Results  Component Value Date   GFRAA 139 08/29/2020     Patient is on  Vitamin D supplement for deficiency.  He is not taking supplementation at this time. Lab Results  Component Value Date   VD25OH 12 (L) 08/29/2020        Current Medications:  Current Outpatient Medications on File Prior to Visit  Medication Sig  . tiZANidine (ZANAFLEX) 4 MG tablet Take 1 tablet (4 mg total) by mouth 3 (three) times daily.   No current facility-administered medications on file prior to visit.    Allergies:  No Known Allergies    Medical History:  Past Medical History:  Diagnosis Date  . Alcohol abuse 09/14/12   Patient does not desire to quit at this time  . Hyperlipidemia   . Vitamin D deficiency      Family history- Reviewed and unchanged Family History  Problem Relation Age of Onset  . Diabetes Mother   . Hypertension Mother   . Alcohol abuse Father   . Cancer Maternal Grandmother        BREAST  . Stroke Maternal Grandfather   . Cancer Paternal Grandmother        lung     Social history- Reviewed and unchanged Social History   Tobacco Use  . Smoking status: Current Some Day Smoker  . Smokeless tobacco: Never Used  Substance Use Topics  . Alcohol use: Yes    Alcohol/week: 0.0 standard drinks  . Drug use: Yes    Types: Marijuana     Names of Other Physician/Practitioners you currently use: 1. [facility] here for primary care 2. Eye Exam: Wear contacts, last exam 04/2020 3. Dental Exam Due for 2021   Patient Care Team: Lucky Cowboy, MD as PCP - General (Internal Medicine)    Screening Tests: Immunization History  Administered Date(s) Administered  . HPV 9-valent 07/07/2018, 09/07/2018  . HPV Quadrivalent 02/27/2010  . PPD Test 01/24/2014, 03/08/2015, 07/07/2018  . Tdap 02/01/2009, 08/29/2020     Vaccinations: TD or Tdap: Due received today  Influenza: DUE for 2021  Pneumococcal: NA Prevnar13: NA Shingles: Zostavax/Shingrix: N/A   Preventative Care: Last colonoscopy:  N/A     Review of Systems:  Review of Systems  Constitutional: Negative for chills, diaphoresis, fever, malaise/fatigue and weight loss.  HENT: Negative for congestion, ear discharge, ear pain, hearing loss, nosebleeds, sinus pain, sore throat and tinnitus.   Eyes: Negative for blurred vision, double vision, photophobia, pain, discharge and redness.  Respiratory: Negative for cough, hemoptysis, sputum production, shortness of breath, wheezing and stridor.   Cardiovascular: Negative for chest pain, palpitations, orthopnea, claudication, leg swelling and PND.  Gastrointestinal: Positive for diarrhea. Negative for abdominal pain, blood in stool, constipation, heartburn, melena, nausea and vomiting.  Genitourinary: Negative for dysuria, flank pain, frequency, hematuria and urgency.  Musculoskeletal: Negative for back pain, falls, joint pain, myalgias and neck pain.  Skin: Negative for itching and rash.  Neurological: Negative for dizziness, tingling, tremors, sensory change, speech change, focal weakness, seizures, loss of consciousness, weakness and headaches.  Endo/Heme/Allergies: Negative for environmental allergies and  polydipsia. Does not bruise/bleed easily.  Psychiatric/Behavioral: Negative for depression, hallucinations, memory loss, substance abuse and suicidal ideas. The patient is not nervous/anxious and does not have insomnia.       Physical Exam: BP 120/78   Pulse 75   Temp (!) 97.3 F (Louis.3 C)   Ht 5\' 9"  (1.753 m)   Wt 223 lb 12.8 oz (101.5 kg)   SpO2 96%   BMI 33.05 kg/m  Wt Readings from Last 3 Encounters:  08/29/20 223 lb 12.8 oz (101.5 kg)  05/16/20 223 lb (101.2 kg)  05/03/20 225 lb 3.2 oz (102.2 kg)   General Appearance: Well nourished, in no apparent distress. Eyes: PERRLA, EOMs, conjunctiva no swelling or erythema Sinuses: No Frontal/maxillary tenderness ENT/Mouth: Ext aud canals clear, TMs without erythema, bulging. No erythema, swelling, or exudate on post  pharynx.  Tonsils not swollen or erythematous. Hearing normal.  Neck: Supple, thyroid normal.  Respiratory: Respiratory effort normal, BS equal bilaterally without rales, rhonchi, wheezing or stridor.  Cardio: RRR with no MRGs. Brisk peripheral pulses without edema.  Abdomen: Soft,obese, + BS.  Non tender, no guarding, rebound, hernias, masses. Lymphatics: Non tender without lymphadenopathy.  Musculoskeletal: Full ROM, 5/5 strength, Normal gait Skin: Warm, dry without rashes, lesions, ecchymosis.  Neuro: Cranial nerves intact. No cerebellar symptoms.  Psych: Awake and oriented X 3, normal affect, Insight and Judgment appropriate.    05/05/20, NP [facility] 12:49 PM

## 2020-08-30 ENCOUNTER — Other Ambulatory Visit: Payer: Self-pay | Admitting: Adult Health Nurse Practitioner

## 2020-08-30 DIAGNOSIS — E559 Vitamin D deficiency, unspecified: Secondary | ICD-10-CM

## 2020-08-30 LAB — CBC WITH DIFFERENTIAL/PLATELET
Absolute Monocytes: 494 cells/uL (ref 200–950)
Basophils Absolute: 33 cells/uL (ref 0–200)
Basophils Relative: 0.5 %
Eosinophils Absolute: 124 cells/uL (ref 15–500)
Eosinophils Relative: 1.9 %
HCT: 45.9 % (ref 38.5–50.0)
Hemoglobin: 16 g/dL (ref 13.2–17.1)
Lymphs Abs: 1547 cells/uL (ref 850–3900)
MCH: 33.3 pg — ABNORMAL HIGH (ref 27.0–33.0)
MCHC: 34.9 g/dL (ref 32.0–36.0)
MCV: 95.6 fL (ref 80.0–100.0)
MPV: 10.7 fL (ref 7.5–12.5)
Monocytes Relative: 7.6 %
Neutro Abs: 4303 cells/uL (ref 1500–7800)
Neutrophils Relative %: 66.2 %
Platelets: 232 10*3/uL (ref 140–400)
RBC: 4.8 10*6/uL (ref 4.20–5.80)
RDW: 12.5 % (ref 11.0–15.0)
Total Lymphocyte: 23.8 %
WBC: 6.5 10*3/uL (ref 3.8–10.8)

## 2020-08-30 LAB — COMPLETE METABOLIC PANEL WITH GFR
AG Ratio: 2.1 (calc) (ref 1.0–2.5)
ALT: 54 U/L — ABNORMAL HIGH (ref 9–46)
AST: 33 U/L (ref 10–40)
Albumin: 4.6 g/dL (ref 3.6–5.1)
Alkaline phosphatase (APISO): 77 U/L (ref 36–130)
BUN: 8 mg/dL (ref 7–25)
CO2: 26 mmol/L (ref 20–32)
Calcium: 9.2 mg/dL (ref 8.6–10.3)
Chloride: 105 mmol/L (ref 98–110)
Creat: 0.72 mg/dL (ref 0.60–1.35)
GFR, Est African American: 139 mL/min/{1.73_m2} (ref >=60)
GFR, Est Non African American: 120 mL/min/{1.73_m2} (ref >=60)
Globulin: 2.2 g/dL (calc) (ref 1.9–3.7)
Glucose, Bld: 106 mg/dL — ABNORMAL HIGH (ref 65–99)
Potassium: 4.1 mmol/L (ref 3.5–5.3)
Sodium: 140 mmol/L (ref 135–146)
Total Bilirubin: 0.9 mg/dL (ref 0.2–1.2)
Total Protein: 6.8 g/dL (ref 6.1–8.1)

## 2020-08-30 LAB — LIPID PANEL
Cholesterol: 181 mg/dL (ref <200)
HDL: 49 mg/dL (ref >=40)
LDL Cholesterol (Calc): 99 mg/dL (calc)
Non-HDL Cholesterol (Calc): 132 mg/dL (calc) — ABNORMAL HIGH (ref <130)
Total CHOL/HDL Ratio: 3.7 (calc) (ref <5.0)
Triglycerides: 214 mg/dL — ABNORMAL HIGH (ref <150)

## 2020-08-30 LAB — VITAMIN D 25 HYDROXY (VIT D DEFICIENCY, FRACTURES): Vit D, 25-Hydroxy: 12 ng/mL — ABNORMAL LOW (ref 30–100)

## 2020-08-30 LAB — HEMOGLOBIN A1C
Hgb A1c MFr Bld: 4.8 % of total Hgb (ref <5.7)
Mean Plasma Glucose: 91 (calc)
eAG (mmol/L): 5 (calc)

## 2020-08-30 LAB — TSH: TSH: 1.44 mIU/L (ref 0.40–4.50)

## 2020-08-30 MED ORDER — CHOLECALCIFEROL 1.25 MG (50000 UT) PO CAPS: ORAL_CAPSULE | ORAL | 0 refills | Status: DC

## 2020-10-30 ENCOUNTER — Encounter: Payer: BC Managed Care – PPO | Admitting: Internal Medicine

## 2021-03-13 ENCOUNTER — Other Ambulatory Visit: Payer: Self-pay

## 2021-03-13 ENCOUNTER — Ambulatory Visit: Payer: No Typology Code available for payment source | Admitting: Adult Health Nurse Practitioner

## 2021-03-13 ENCOUNTER — Encounter: Payer: Self-pay | Admitting: Adult Health Nurse Practitioner

## 2021-03-13 VITALS — BP 118/78 | HR 83 | Temp 97.3°F | Ht 69.75 in | Wt 226.3 lb

## 2021-03-13 DIAGNOSIS — R7309 Other abnormal glucose: Secondary | ICD-10-CM

## 2021-03-13 DIAGNOSIS — Z0000 Encounter for general adult medical examination without abnormal findings: Secondary | ICD-10-CM | POA: Diagnosis not present

## 2021-03-13 DIAGNOSIS — E559 Vitamin D deficiency, unspecified: Secondary | ICD-10-CM

## 2021-03-13 DIAGNOSIS — M5417 Radiculopathy, lumbosacral region: Secondary | ICD-10-CM

## 2021-03-13 DIAGNOSIS — Z1329 Encounter for screening for other suspected endocrine disorder: Secondary | ICD-10-CM

## 2021-03-13 DIAGNOSIS — F1010 Alcohol abuse, uncomplicated: Secondary | ICD-10-CM

## 2021-03-13 DIAGNOSIS — Z131 Encounter for screening for diabetes mellitus: Secondary | ICD-10-CM

## 2021-03-13 DIAGNOSIS — R202 Paresthesia of skin: Secondary | ICD-10-CM

## 2021-03-13 DIAGNOSIS — R030 Elevated blood-pressure reading, without diagnosis of hypertension: Secondary | ICD-10-CM

## 2021-03-13 DIAGNOSIS — Z79899 Other long term (current) drug therapy: Secondary | ICD-10-CM

## 2021-03-13 DIAGNOSIS — Z136 Encounter for screening for cardiovascular disorders: Secondary | ICD-10-CM

## 2021-03-13 DIAGNOSIS — Z1389 Encounter for screening for other disorder: Secondary | ICD-10-CM

## 2021-03-13 DIAGNOSIS — Z113 Encounter for screening for infections with a predominantly sexual mode of transmission: Secondary | ICD-10-CM

## 2021-03-13 DIAGNOSIS — Z0001 Encounter for general adult medical examination with abnormal findings: Secondary | ICD-10-CM

## 2021-03-13 DIAGNOSIS — Z6832 Body mass index (BMI) 32.0-32.9, adult: Secondary | ICD-10-CM

## 2021-03-13 DIAGNOSIS — E782 Mixed hyperlipidemia: Secondary | ICD-10-CM

## 2021-03-13 DIAGNOSIS — R197 Diarrhea, unspecified: Secondary | ICD-10-CM

## 2021-03-13 NOTE — Progress Notes (Signed)
COMPLETE PHYSICAL   Assessment and Plan:   Grason was seen today for annual exam.  Diagnoses and all orders for this visit:  Encounter for routine adult medical exam with abnormal findings Yearly  Elevated BP without diagnosis of hypertension No medications Monitor blood pressure at home; call if consistently over 130/80 Continue DASH diet.   Reminder to go to the ER if any CP, SOB, nausea, dizziness, severe HA, changes vision/speech, left arm numbness and tingling and jaw pain.  Hyperlipidemia, mixed No medications Discussed dietary and exercise modifications Low fat diet -     Lipid panel  Abnormal glucose Discussed dietary and exercise modifications  BMI 32.0-32.9,adult Discussed dietary and exercise modifications  Alcohol abuse Discussed decreasing use of this Discussed safety, self and child  Vitamin D deficiency No supplement at this time -     VITAMIN D 25 Hydroxy (Vit-D Deficiency, Fractures)  Diarrhea, unspecified type Discussed at length with patient STOP eating fried foods! Discussed packing lunch instead of eating whatever is quick to fix/fry at work.  Medication management Continued  Screening, ischemic heart disease -     EKG 12-Lead  Screening for thyroid disorder -     TSH  Screening for diabetes mellitus -     Hemoglobin A1c  Screening for blood or protein in urine -     Urinalysis w microscopic + reflex cultur  Routine screening for STI (sexually transmitted infection) -     C. trachomatis/N. gonorrhoeae RNA -     HIV Antibody (routine testing w rflx) -     RPR  Other orders -     REFLEXIVE URINE CULTURE    Continue diet and meds as discussed. Further disposition pending results of labs. Discussed med's effects and SE's.  Patient agrees with plan of care and opportunity to ask questions/voice concerns. Over 30 minutes of face to face interview, chart review, exam, counseling, and critical decision making was performed.    Future Appointments  Date Time Provider Department Center  03/13/2022  9:00 AM Elder Negus, NP [facility] None    ----------------------------------------------------------------------------------------------------------------------  HPI 35 y.o. male  presents for complete physical and follow up on HTN, HLD, abnormal glucose with history of pre-diabetes, weight and vitamin D deficiency.   He reports overall he is doing well today.  He denies any health concerns.  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  He is employed at a country [employer].    He continues to have poor dietary intake and zero exercise. He has a four month old, little girl. Not currently not in relationship with baby's mother. He gets to spend time with daughter while at mothers home.  He admits that he does not eat healthy and does consume a lot of fried / greasy foods.  He reports that he also drinks anywhere from 4-6 drink a night.  He also reports smoking marijuana on occasion.  He does not smoke tobacco.  Denies depression, no SI/HI.  Reports some increasing anxiety related to ew baby in his life.  Discussed management of this.  Discussed escitalopram as well.  Patient resistant but knows he needs to make some lifestyle changes.  Discussed dietary changes and weight loss at length with patient. BMI is Body mass index is 32.7 kg/m., he has not been working on diet and exercise. Wt Readings from Last 3 Encounters:  03/13/21 226 lb 4.8 oz (102.6 kg)  08/29/20 223  lb 12.8 oz (101.5 kg)  05/16/20 223 lb (101.2 kg)    His blood pressure has been controlled at home, today their BP is BP: 118/78  He does workout. He denies any cardiac symptoms, chest pains, palpitations, shortness of breath, dizziness or lower extremity edema.      He is not on cholesterol medication   His cholesterol is not at goal. The cholesterol last visit was:   Lab Results  Component Value Date    CHOL 181 08/29/2020   HDL 49 08/29/2020   LDLCALC 99 08/29/2020   TRIG 214 (H) 08/29/2020   CHOLHDL 3.7 08/29/2020    He has not been working on diet and exercise for prediabetes, and denies increased appetite, nausea, paresthesia of the feet, polydipsia, polyuria, visual disturbances, vomiting and weight loss. Last A1C in the office was:  Lab Results  Component Value Date   HGBA1C 4.8 08/29/2020     Patient does not have history of CKD.  Their last GFR was:  Lab Results  Component Value Date   GFRNONAA 120 08/29/2020   Lab Results  Component Value Date   GFRAA 139 08/29/2020     Patient is on Vitamin D supplement for deficiency.  He is not taking supplementation at this time. Lab Results  Component Value Date   VD25OH 12 (L) 08/29/2020        Current Medications:  No current outpatient medications on file prior to visit.   No current facility-administered medications on file prior to visit.    Allergies:  No Known Allergies    Medical History:  Past Medical History:  Diagnosis Date  . Alcohol abuse 09/14/12   Patient does not desire to quit at this time  . Hyperlipidemia   . Vitamin D deficiency      Family history- Reviewed and unchanged Family History  Problem Relation Age of Onset  . Diabetes Mother   . Hypertension Mother   . Alcohol abuse Father   . Cancer Maternal Grandmother        BREAST  . Stroke Maternal Grandfather   . Cancer Paternal Grandmother        lung     Social history- Reviewed and unchanged Social History   Tobacco Use  . Smoking status: Current Some Day Smoker  . Smokeless tobacco: Never Used  Substance Use Topics  . Alcohol use: Yes    Alcohol/week: 0.0 standard drinks  . Drug use: Yes    Types: Marijuana     Names of Other Physician/Practitioners you currently use: 1. [facility] here for primary care 2. Eye Exam: Wear contacts, last exam 02/2021 scheduled 3. Dental Exam  Due for 04/2021   Patient Care Team: Lucky Cowboy, MD as PCP - General (Internal Medicine)    Screening Tests: Immunization History  Administered Date(s) Administered  . HPV 9-valent 07/07/2018, 09/07/2018  . HPV Quadrivalent 02/27/2010  . PPD Test 01/24/2014, 03/08/2015, 07/07/2018  . Tdap 02/01/2009, 08/29/2020     Vaccinations: TD or Tdap: 08/2020 Influenza: DUE for 2022  Pneumococcal: NA Prevnar13: NA Shingles: Zostavax/Shingrix: N/A   Preventative Care: Last colonoscopy: N/A     Review of Systems:  Review of Systems  Constitutional: Negative for chills, diaphoresis, fever, malaise/fatigue and weight loss.  HENT: Negative for congestion, ear discharge, ear pain, hearing loss, nosebleeds, sinus pain, sore throat and tinnitus.   Eyes: Negative for blurred vision, double vision, photophobia, pain, discharge and redness.  Respiratory: Negative for cough, hemoptysis,  sputum production, shortness of breath, wheezing and stridor.   Cardiovascular: Negative for chest pain, palpitations, orthopnea, claudication, leg swelling and PND.  Gastrointestinal: Negative for abdominal pain, blood in stool, constipation, diarrhea, heartburn, melena, nausea and vomiting.  Genitourinary: Negative for dysuria, flank pain, frequency, hematuria and urgency.  Musculoskeletal: Negative for back pain, falls, joint pain, myalgias and neck pain.  Skin: Negative for itching and rash.  Neurological: Negative for dizziness, tingling, tremors, sensory change, speech change, focal weakness, seizures, loss of consciousness, weakness and headaches.  Endo/Heme/Allergies: Negative for environmental allergies and polydipsia. Does not bruise/bleed easily.  Psychiatric/Behavioral: Negative for depression, hallucinations, memory loss, substance abuse and suicidal ideas. The patient is not nervous/anxious and does not have insomnia.     Physical Exam: BP 118/78   Pulse 83   Temp (!) 97.3 F (36.3 C)    Ht 5' 9.75" (1.772 m)   Wt 226 lb 4.8 oz (102.6 kg)   SpO2 99%   BMI 32.70 kg/m  Wt Readings from Last 3 Encounters:  03/13/21 226 lb 4.8 oz (102.6 kg)  08/29/20 223 lb 12.8 oz (101.5 kg)  05/16/20 223 lb (101.2 kg)   General Appearance: Well nourished, in no apparent distress. Eyes: PERRLA, EOMs, conjunctiva no swelling or erythema Sinuses: No Frontal/maxillary tenderness ENT/Mouth: Ext aud canals clear, TMs without erythema, bulging. No erythema, swelling, or exudate on post pharynx.  Tonsils not swollen or erythematous. Hearing normal.  Neck: Supple, thyroid normal.  Respiratory: Respiratory effort normal, BS equal bilaterally without rales, rhonchi, wheezing or stridor.  Cardio: RRR with no MRGs. Brisk peripheral pulses without edema.  Abdomen: Soft,obese, + BS.  Non tender, no guarding, rebound, hernias, masses. Lymphatics: Non tender without lymphadenopathy.  Musculoskeletal: Full ROM, 5/5 strength, Normal gait Skin: Warm, dry without rashes, lesions, ecchymosis.  Neuro: Cranial nerves intact. No cerebellar symptoms.  Psych: Awake and oriented X 3, affect flat, Insight and Judgment appropriate.     Elder Negus, Edrick Oh, DNP [facility] 03/13/2021  10:25 AM

## 2021-03-14 LAB — COMPLETE METABOLIC PANEL WITH GFR
AG Ratio: 1.9 (calc) (ref 1.0–2.5)
ALT: 73 U/L — ABNORMAL HIGH (ref 9–46)
AST: 33 U/L (ref 10–40)
Albumin: 4.4 g/dL (ref 3.6–5.1)
Alkaline phosphatase (APISO): 78 U/L (ref 36–130)
BUN: 11 mg/dL (ref 7–25)
CO2: 26 mmol/L (ref 20–32)
Calcium: 9.2 mg/dL (ref 8.6–10.3)
Chloride: 106 mmol/L (ref 98–110)
Creat: 0.66 mg/dL (ref 0.60–1.35)
GFR, Est African American: 143 mL/min/{1.73_m2} (ref >=60)
GFR, Est Non African American: 124 mL/min/{1.73_m2} (ref >=60)
Globulin: 2.3 g/dL (calc) (ref 1.9–3.7)
Glucose, Bld: 97 mg/dL (ref 65–99)
Potassium: 4.4 mmol/L (ref 3.5–5.3)
Sodium: 140 mmol/L (ref 135–146)
Total Bilirubin: 0.6 mg/dL (ref 0.2–1.2)
Total Protein: 6.7 g/dL (ref 6.1–8.1)

## 2021-03-14 LAB — URINALYSIS W MICROSCOPIC + REFLEX CULTURE
Bacteria, UA: NONE SEEN /HPF
Bilirubin Urine: NEGATIVE
Glucose, UA: NEGATIVE
Hgb urine dipstick: NEGATIVE
Hyaline Cast: NONE SEEN /LPF
Ketones, ur: NEGATIVE
Leukocyte Esterase: NEGATIVE
Nitrites, Initial: NEGATIVE
Protein, ur: NEGATIVE
RBC / HPF: NONE SEEN /HPF (ref 0–2)
Specific Gravity, Urine: 1.023 (ref 1.001–1.03)
Squamous Epithelial / HPF: NONE SEEN /HPF (ref <=5)
WBC, UA: NONE SEEN /HPF (ref 0–5)
pH: 5.5 (ref 5.0–8.0)

## 2021-03-14 LAB — C. TRACHOMATIS/N. GONORRHOEAE RNA
C. trachomatis RNA, TMA: NOT DETECTED
N. gonorrhoeae RNA, TMA: NOT DETECTED

## 2021-03-14 LAB — LIPID PANEL
Cholesterol: 198 mg/dL (ref <200)
HDL: 51 mg/dL (ref >=40)
LDL Cholesterol (Calc): 131 mg/dL (calc) — ABNORMAL HIGH
Non-HDL Cholesterol (Calc): 147 mg/dL (calc) — ABNORMAL HIGH (ref <130)
Total CHOL/HDL Ratio: 3.9 (calc) (ref <5.0)
Triglycerides: 67 mg/dL (ref <150)

## 2021-03-14 LAB — CBC WITH DIFFERENTIAL/PLATELET
Absolute Monocytes: 640 cells/uL (ref 200–950)
Basophils Absolute: 40 cells/uL (ref 0–200)
Basophils Relative: 0.5 %
Eosinophils Absolute: 160 cells/uL (ref 15–500)
Eosinophils Relative: 2 %
HCT: 44.7 % (ref 38.5–50.0)
Hemoglobin: 15.5 g/dL (ref 13.2–17.1)
Lymphs Abs: 1696 cells/uL (ref 850–3900)
MCH: 33.6 pg — ABNORMAL HIGH (ref 27.0–33.0)
MCHC: 34.7 g/dL (ref 32.0–36.0)
MCV: 97 fL (ref 80.0–100.0)
MPV: 10.7 fL (ref 7.5–12.5)
Monocytes Relative: 8 %
Neutro Abs: 5464 cells/uL (ref 1500–7800)
Neutrophils Relative %: 68.3 %
Platelets: 214 10*3/uL (ref 140–400)
RBC: 4.61 10*6/uL (ref 4.20–5.80)
RDW: 12.4 % (ref 11.0–15.0)
Total Lymphocyte: 21.2 %
WBC: 8 10*3/uL (ref 3.8–10.8)

## 2021-03-14 LAB — HEMOGLOBIN A1C
Hgb A1c MFr Bld: 4.8 % of total Hgb (ref <5.7)
Mean Plasma Glucose: 91 mg/dL
eAG (mmol/L): 5 mmol/L

## 2021-03-14 LAB — VITAMIN D 25 HYDROXY (VIT D DEFICIENCY, FRACTURES): Vit D, 25-Hydroxy: 12 ng/mL — ABNORMAL LOW (ref 30–100)

## 2021-03-14 LAB — HIV ANTIBODY (ROUTINE TESTING W REFLEX): HIV 1&2 Ab, 4th Generation: NONREACTIVE

## 2021-03-14 LAB — RPR: RPR Ser Ql: NONREACTIVE

## 2021-03-14 LAB — TSH: TSH: 1.42 mIU/L (ref 0.40–4.50)

## 2021-03-14 LAB — NO CULTURE INDICATED

## 2022-02-12 ENCOUNTER — Ambulatory Visit
Admission: EM | Admit: 2022-02-12 | Discharge: 2022-02-12 | Disposition: A | Discharge status: Home / Self Care | Payer: BLUE CROSS/BLUE SHIELD | Attending: Emergency Medicine | Admitting: Emergency Medicine

## 2022-02-12 ENCOUNTER — Other Ambulatory Visit: Payer: Self-pay

## 2022-02-12 ENCOUNTER — Encounter: Payer: Self-pay | Admitting: Emergency Medicine

## 2022-02-12 DIAGNOSIS — M62830 Muscle spasm of back: Secondary | ICD-10-CM

## 2022-02-12 DIAGNOSIS — S39012D Strain of muscle, fascia and tendon of lower back, subsequent encounter: Secondary | ICD-10-CM

## 2022-02-12 MED ORDER — METHYLPREDNISOLONE 4 MG PO TBPK: ORAL_TABLET | ORAL | 0 refills | Status: DC

## 2022-02-12 MED ORDER — KETOROLAC TROMETHAMINE 60 MG/2ML IM SOLN
60.0000 mg | Freq: Once | INTRAMUSCULAR | Status: AC
  Administered 2022-02-12: 60 mg via INTRAMUSCULAR

## 2022-02-12 MED ORDER — BACLOFEN 10 MG PO TABS: 10.0000 mg | ORAL_TABLET | Freq: Three times a day (TID) | ORAL | 0 refills | Status: AC

## 2022-02-12 NOTE — ED Provider Notes (Signed)
[facility]    CSN: [id number] Arrival date & time: 02/12/22  1026    HISTORY   Chief Complaint  Patient presents with   Back Pain   HPI Louis Martinez is a 36 y.o. male. Pt reports was vomiting yesterday and felt severe lower right back pains. Pains worse with bending or changing positions.  EMR reviewed, patient has had several episodes prior to this 1.  Patient states he has never had imaging done.  Patient states he does not have insurance, politely declines lumbar spine x-ray today.  Patient ambulated independently into the clinic today.  Patient is in no acute distress at this time.  Vital signs are normal.  The history is provided by the patient.  Past Medical History:  Diagnosis Date   Alcohol abuse 09/14/12   Patient does not desire to quit at this time   Hyperlipidemia    Vitamin D deficiency    Patient Active Problem List   Diagnosis Date Noted   Abnormal glucose 09/27/2019   Elevated BP without diagnosis of hypertension 09/27/2019   Hip flexor tightness 05/26/2018   Hyperlipidemia, mixed    Vitamin D deficiency    Alcohol abuse 09/14/2012   Past Surgical History:  Procedure Laterality Date   APPENDECTOMY     36 year old    Home Medications    Prior to Admission medications   Not on File    Family History Family History  Problem Relation Age of Onset   Diabetes Mother    Hypertension Mother    Alcohol abuse Father    Cancer Maternal Grandmother        BREAST   Stroke Maternal Grandfather    Cancer Paternal Grandmother        lung   Social History Social History   Tobacco Use   Smoking status: Some Days   Smokeless tobacco: Never  Substance Use Topics   Alcohol use: Yes    Alcohol/week: 0.0 standard drinks   Drug use: Yes    Types: Marijuana   Allergies   Patient has no known allergies.  Review of Systems Review of Systems Pertinent findings noted in history of present illness.   Physical Exam Triage Vital Signs ED  Triage Vitals  Enc Vitals Group     BP 10/05/21 0827 (!) 147/82     Pulse Rate 10/05/21 0827 72     Resp 10/05/21 0827 18     Temp 10/05/21 0827 98.3 F (36.8 C)     Temp Source 10/05/21 0827 Oral     SpO2 10/05/21 0827 98 %     Weight --      Height --      Head Circumference --      Peak Flow --      Pain Score 10/05/21 0826 5     Pain Loc --      Pain Edu? --      Excl. in GC? --   No data found.  Updated Vital Signs BP 127/80 (BP Location: Left Arm)    Pulse 89    Temp 98.2 F (36.8 C) (Oral)    Resp 17    SpO2 95%   Physical Exam Vitals and nursing note reviewed.  Constitutional:      General: He is not in acute distress.    Appearance: Normal appearance. He is not ill-appearing.  HENT:     Head: Normocephalic and atraumatic.  Eyes:     General: Lids are  normal.        Right eye: No discharge.        Left eye: No discharge.     Extraocular Movements: Extraocular movements intact.     Conjunctiva/sclera: Conjunctivae normal.     Right eye: Right conjunctiva is not injected.     Left eye: Left conjunctiva is not injected.  Neck:     Trachea: Trachea and phonation normal.  Cardiovascular:     Rate and Rhythm: Normal rate and regular rhythm.     Pulses: Normal pulses.     Heart sounds: Normal heart sounds. No murmur heard.   No friction rub. No gallop.  Pulmonary:     Effort: Pulmonary effort is normal. No accessory muscle usage, prolonged expiration or respiratory distress.     Breath sounds: Normal breath sounds. No stridor, decreased air movement or transmitted upper airway sounds. No decreased breath sounds, wheezing, rhonchi or rales.  Chest:     Chest wall: No tenderness.  Musculoskeletal:        General: Tenderness (Lumbar paraspinous muscles) present. Normal range of motion.     Cervical back: Normal range of motion and neck supple. Normal range of motion.  Lymphadenopathy:     Cervical: No cervical adenopathy.  Skin:    General: Skin is warm and dry.      Findings: No erythema or rash.  Neurological:     General: No focal deficit present.     Mental Status: He is alert and oriented to person, place, and time.  Psychiatric:        Mood and Affect: Mood normal.        Behavior: Behavior normal.    Visual Acuity Right Eye Distance:   Left Eye Distance:   Bilateral Distance:    Right Eye Near:   Left Eye Near:    Bilateral Near:     UC Couse / Diagnostics / Procedures:    EKG  Radiology No results found.  Procedures Procedures (including critical care time)  UC Diagnoses / Final Clinical Impressions(s)   I have reviewed the triage vital signs and the nursing notes.  Pertinent labs & imaging results that were available during my care of the patient were reviewed by me and considered in my medical decision making (see chart for details).    Final diagnoses:  Lumbar strain, subsequent encounter  Lumbar paraspinal muscle spasm   Patient provided with ketorolac injection and a Medrol Dosepak.  Patient advised not to exercise or stretch until pain is resolved.  Okay to use ice, topical analgesics.  Return precautions advised. ED Prescriptions     Medication Sig Dispense Auth. Provider   baclofen (LIORESAL) 10 MG tablet Take 1 tablet (10 mg total) by mouth 3 (three) times daily for 7 days. 21 tablet Theadora RamaMorgan, Orvilla Truett Scales, PA-C   methylPREDNISolone (MEDROL DOSEPAK) 4 MG TBPK tablet Take 24 mg on day 1, 20 mg on day 2, 16 mg on day 3, 12 mg on day 4, 8 mg on day 5, 4 mg on day 6. 21 tablet Theadora RamaMorgan, Jaslyn Bansal Scales, PA-C      PDMP not reviewed this encounter.  Pending results:  Labs Reviewed - No data to display  Medications Ordered in UC: Medications  ketorolac (TORADOL) injection 60 mg (60 mg Intramuscular Given 02/12/22 1102)    Disposition Upon Discharge:  Condition: stable for discharge home Home: take medications as prescribed; routine discharge instructions as discussed; follow up as advised.  Patient presented  with  an acute illness with associated systemic symptoms and significant discomfort requiring urgent management. In my opinion, this is a condition that a prudent lay person (someone who possesses an average knowledge of health and medicine) may potentially expect to result in complications if not addressed urgently such as respiratory distress, impairment of bodily function or dysfunction of bodily organs.   Routine symptom specific, illness specific and/or disease specific instructions were discussed with the patient and/or caregiver at length.   As such, the patient has been evaluated and assessed, work-up was performed and treatment was provided in alignment with urgent care protocols and evidence based medicine.  Patient/parent/caregiver has been advised that the patient may require follow up for further testing and treatment if the symptoms continue in spite of treatment, as clinically indicated and appropriate.  If the patient was tested for COVID-19, Influenza and/or RSV, then the patient/parent/guardian was advised to isolate at home pending the results of his/her diagnostic coronavirus test and potentially longer if theyre positive. I have also advised pt that if his/her COVID-19 test returns positive, it's recommended to self-isolate for at least 10 days after symptoms first appeared AND until fever-free for 24 hours without fever reducer AND other symptoms have improved or resolved. Discussed self-isolation recommendations as well as instructions for household member/close contacts as per the [facility] and [place] DHHS, and also gave patient the COVID packet with this information.  Patient/parent/caregiver has been advised to return to the [facility] or PCP in 3-5 days if no better; to PCP or the Emergency Department if new signs and symptoms develop, or if the current signs or symptoms continue to change or worsen for further workup, evaluation and treatment as clinically indicated and appropriate  The patient  will follow up with their current PCP if and as advised. If the patient does not currently have a PCP we will assist them in obtaining one.   The patient may need specialty follow up if the symptoms continue, in spite of conservative treatment and management, for further workup, evaluation, consultation and treatment as clinically indicated and appropriate.   Patient/parent/caregiver verbalized understanding and agreement of plan as discussed.  All questions were addressed during visit.  Please see discharge instructions below for further details of plan.  Discharge Instructions:   Discharge Instructions      You received an injection of ketorolac during your visit today.  This should significantly resolve your pain for the next 6 to 8 hours.    I recommend that you begin taking a muscle relaxer called baclofen.  This this medication also relieves muscle spasm in addition to relax the muscles and I find it very effective in treating patients with lower back pain.  You can take this medication 3 times daily but if you find that it makes you too sleepy during the daytime, you can certainly double at night and/or break the tablets in half during the daytime.  Please do not take more than 30 mg in a 24-hour period.  Beginning tomorrow morning, please begin taking methylprednisolone, 1 full row of tablets daily for the next 6 days.  Please do not attempt to space the tablets out into multiple doses throughout the day, they will not work as well if taken that way.  If you would like to return in a day or 2 for a second ketorolac injection, please feel free to do so.  Thank you for visiting urgent care today, I appreciate the opportunity to participate in your care.  This office note has been dictated using Teaching laboratory technician.  Unfortunately, and despite my best efforts, this method of dictation can sometimes lead to occasional typographical or grammatical errors.  I apologize in  advance if this occurs.     Theadora Rama Scales, [place] 02/12/22 [phone]

## 2022-02-12 NOTE — Discharge Instructions (Addendum)
You received an injection of ketorolac during your visit today.  This should significantly resolve your pain for the next 6 to 8 hours.   ? ?I recommend that you begin taking a muscle relaxer called baclofen.  This this medication also relieves muscle spasm in addition to relax the muscles and I find it very effective in treating patients with lower back pain.  You can take this medication 3 times daily but if you find that it makes you too sleepy during the daytime, you can certainly double at night and/or break the tablets in half during the daytime.  Please do not take more than 30 mg in a 24-hour period. ? ?Beginning tomorrow morning, please begin taking methylprednisolone, 1 full row of tablets daily for the next 6 days.  Please do not attempt to space the tablets out into multiple doses throughout the day, they will not work as well if taken that way. ? ?If you would like to return in a day or 2 for a second ketorolac injection, please feel free to do so. ? ?Thank you for visiting urgent care today, I appreciate the opportunity to participate in your care. ?

## 2022-02-12 NOTE — ED Triage Notes (Signed)
Pt reports was vomiting yesterday and felt severe lower right back pains. Pains worse with bending or changing positions.  ?

## 2022-03-13 ENCOUNTER — Encounter: Payer: No Typology Code available for payment source | Admitting: Nurse Practitioner

## 2022-04-03 ENCOUNTER — Encounter: Payer: BC Managed Care – PPO | Admitting: Nurse Practitioner

## 2022-04-17 ENCOUNTER — Encounter: Payer: Self-pay | Admitting: Nurse Practitioner

## 2022-04-17 ENCOUNTER — Ambulatory Visit: Payer: BC Managed Care – PPO | Admitting: Nurse Practitioner

## 2022-04-17 VITALS — BP 102/64 | HR 85 | Temp 97.5°F | Ht 69.0 in | Wt 224.6 lb

## 2022-04-17 DIAGNOSIS — Z136 Encounter for screening for cardiovascular disorders: Secondary | ICD-10-CM

## 2022-04-17 DIAGNOSIS — Z79899 Other long term (current) drug therapy: Secondary | ICD-10-CM

## 2022-04-17 DIAGNOSIS — R030 Elevated blood-pressure reading, without diagnosis of hypertension: Secondary | ICD-10-CM | POA: Diagnosis not present

## 2022-04-17 DIAGNOSIS — E559 Vitamin D deficiency, unspecified: Secondary | ICD-10-CM

## 2022-04-17 DIAGNOSIS — Z130 Encounter for screening for diseases of the blood and blood-forming organs and certain disorders involving the immune mechanism: Secondary | ICD-10-CM

## 2022-04-17 DIAGNOSIS — Z13220 Encounter for screening for lipoid disorders: Secondary | ICD-10-CM | POA: Diagnosis not present

## 2022-04-17 DIAGNOSIS — M5417 Radiculopathy, lumbosacral region: Secondary | ICD-10-CM

## 2022-04-17 DIAGNOSIS — Z1389 Encounter for screening for other disorder: Secondary | ICD-10-CM | POA: Diagnosis not present

## 2022-04-17 DIAGNOSIS — M79671 Pain in right foot: Secondary | ICD-10-CM

## 2022-04-17 DIAGNOSIS — Z113 Encounter for screening for infections with a predominantly sexual mode of transmission: Secondary | ICD-10-CM | POA: Diagnosis not present

## 2022-04-17 DIAGNOSIS — Z131 Encounter for screening for diabetes mellitus: Secondary | ICD-10-CM

## 2022-04-17 DIAGNOSIS — E782 Mixed hyperlipidemia: Secondary | ICD-10-CM

## 2022-04-17 DIAGNOSIS — F1010 Alcohol abuse, uncomplicated: Secondary | ICD-10-CM

## 2022-04-17 DIAGNOSIS — Z1329 Encounter for screening for other suspected endocrine disorder: Secondary | ICD-10-CM

## 2022-04-17 DIAGNOSIS — Z0000 Encounter for general adult medical examination without abnormal findings: Secondary | ICD-10-CM | POA: Diagnosis not present

## 2022-04-17 DIAGNOSIS — R202 Paresthesia of skin: Secondary | ICD-10-CM

## 2022-04-17 DIAGNOSIS — R7309 Other abnormal glucose: Secondary | ICD-10-CM

## 2022-04-17 NOTE — Progress Notes (Signed)
COMPLETE PHYSICAL ? ? ?Assessment and Plan:  ? ?Louis Martinez was seen today for annual exam. ? ?Diagnoses and all orders for this visit: ? ?1. Encounter for annual physical exam ?Due Annually ? ?- CBC with Differential/Platelet ?- COMPLETE METABOLIC PANEL WITH GFR ?- Lipid panel ?- TSH ?- Hemoglobin A1c ?- Insulin, random ?- VITAMIN D 25 Hydroxy (Vit-D Deficiency, Fractures) ?- EKG 12-Lead ?- Urinalysis w microscopic + reflex cultur ?- RPR ?- HIV Antibody (routine testing w rflx) ?- HSV(herpes simplex vrs) 1+2 ab-IgG ?- C. trachomatis/N. gonorrhoeae RNA ?- Vitamin B12 ? ?2. Elevated BP without diagnosis of hypertension ?Currently at goal without medication. ?Monitor blood pressure at home ?Call if consistently over 130/80 ?Discussed lifestyle modifications, weight loss, activity.  ?Reminder to go to the ER if any CP, SOB, nausea, dizziness, severe HA, changes vision/speech, left arm numbness and tingling and jaw pain. ? ?- CBC with Differential/Platelet ? ?3. Hyperlipidemia, mixed ?Last elevated LDL ? ?- CBC with Differential/Platelet ?- COMPLETE METABOLIC PANEL WITH GFR ?- Lipid panel ? ?4. Alcohol abuse ?Discussed decreasing use of this ?Discussed safety, self and child ?He will continue to cut down ? ?5. Vitamin D deficiency ?No supplement at this time ? ?-     VITAMIN D 25 Hydroxy (Vit-D Deficiency, Fractures) ? ?6. Abnormal glucose ? ?- Hemoglobin A1c ? ?7. Medication management ? ?- CBC with Differential/Platelet ?- COMPLETE METABOLIC PANEL WITH GFR ?- Lipid panel ?- TSH ?- Hemoglobin A1c ?- Insulin, random ?- VITAMIN D 25 Hydroxy (Vit-D Deficiency, Fractures) ?- EKG 12-Lead ?- Vitamin B12 ? ?8. Screening, ischemic heart disease ?NSR ? ?- EKG 12-Lead ? ?9. Screening for thyroid disorder ? ?- TSH ? ?10. Screening for diabetes mellitus ? ?- Hemoglobin A1c ? ?11. Screening for blood or protein in urine ? ?- Urinalysis w microscopic + reflex cultur ? ?12. Routine screening for STI (sexually transmitted  infection) ?Continue safe sex practices ? ?- RPR ?- HIV Antibody (routine testing w rflx) ?- HSV(herpes simplex vrs) 1+2 ab-IgG ?- C. trachomatis/N. gonorrhoeae RNA ? ?13. Bilateral foot pain ?Discussed purchasing new shoes, fitted inserts ?Discussed plantar factitias exercises. ?Updated x-ray/referral to Orthopedics if no improvement.  ? ?14. Lumbosacral radiculopathy ?Currently controlled ?Discussed back/stretching exercises. ?Good shoe support.  ?Continue weight loss ? ? ?15. Numbness and tingling ?Discussed decreasing alcohol intake. ? ?- Vitamin B12 ? ? ? ?Continue diet and meds as discussed. Further disposition pending results of labs. Discussed med's effects and SE's.  Patient agrees with plan of care and opportunity to ask questions/voice concerns. ?Over 30 minutes of face to face interview, chart review, exam, counseling, and critical decision making was performed.  ? ?Future Appointments  ?Date Time Provider Department Center  ?04/18/2023 10:00 AM Adela Glimpse, NP [facility] None  ? ? ?---------------------------------------------------------------------------------------------------------------------- ? ?HPI ?36 y.o. male  presents for complete physical and follow up on HTN, HLD, abnormal glucose with history of pre-diabetes, weight and vitamin D deficiency.  ? ?He has an 38 month old, little girl. He has a new baby on the way. Not currently not in relationship with baby's mother.  ? ?He reports overall he is doing well today.  He denies any health concerns.  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  He is employed at a [employer].  Has noticed increase foot pain, gradually increasing over the years.  He denies good shoe support.  Has not changed out his shoes in 2  years.  He wears Birkenstocks on a daily basis.  He has noticed increase in BLE numbness and tingling.  ? ?[facility] lower [facility] pain flare.  Had prednisone and muscle  relaxer which helped.  Evonnie Patian is now improved.  ? ?He continues to have poor dietary intake and zero exercise. He admits that he does not eat healthy and does consume a lot of fried / greasy foods.  He reports that he also drinks anywhere from 4-6 drink a night.  He also reports smoking marijuana on occasion.  He does not smoke tobacco. ? ?Discussed dietary changes and weight loss at length with patient. ?BMI is Body mass index is 33.17 kg/m?., he has not been working on diet and exercise.  He has lost 2 lb in the last year.  ?Wt Readings from Last 3 Encounters:  ?04/17/22 224 lb 9.6 oz (101.9 kg)  ?03/13/21 226 lb 4.8 oz (102.6 kg)  ?08/29/20 223 lb 12.8 oz (101.5 kg)  ? ? ?His blood pressure has been controlled at home, today their BP is BP: 102/64 ? He does workout. He denies any cardiac symptoms, chest pains, palpitations, shortness of breath, dizziness or lower extremity edema.   ? ? ? He is not on cholesterol medication   His cholesterol is not at goal. The cholesterol last visit was:   ?Lab Results  ?Component Value Date  ? CHOL 198 03/13/2021  ? HDL 51 03/13/2021  ? LDLCALC 131 (H) 03/13/2021  ? TRIG 67 03/13/2021  ? CHOLHDL 3.9 03/13/2021  ? ? He has not been working on diet and exercise for prediabetes. Last A1C in the office was:  ?Lab Results  ?Component Value Date  ? HGBA1C 4.8 03/13/2021  ? ?Patient does not have history of CKD.  Their last GFR was: ? ?Lab Results  ?Component Value Date  ? GFRNONAA 124 03/13/2021  ? ?Lab Results  ?Component Value Date  ? GFRAA 143 03/13/2021  ? ?Patient is not on Vitamin D supplement for deficiency.  Last Vitamin D was not at goal. ?Lab Results  ?Component Value Date  ? VD25OH 12 (L) 03/13/2021  ?   ? ?Current Medications:  ?Current Outpatient Medications on File Prior to Visit  ?Medication Sig  ? methylPREDNISolone (MEDROL DOSEPAK) 4 MG TBPK tablet Take 24 mg on day 1, 20 mg on day 2, 16 mg on day 3, 12 mg on day 4, 8 mg on day 5, 4 mg on day 6. (Patient not taking:  Reported on 04/17/2022)  ? ?No current facility-administered medications on file prior to visit.  ? ? ?Allergies:  ?No Known Allergies  ? ? ?Medical History:  ?Past Medical History:  ?Diagnosis Date  ? Alcohol abuse 09/14/12  ? Patient does not desire to quit at this time  ? Hyperlipidemia   ? Vitamin D deficiency   ? ? ? ?Family history- Reviewed and unchanged ?Family History  ?Problem Relation Age of Onset  ? Diabetes Mother   ? Hypertension Mother   ? Alcohol abuse Father   ? Cancer Maternal Grandmother   ?     BREAST  ? Stroke Maternal Grandfather   ? Cancer Paternal Grandmother   ?     lung  ? ? ? ?Social history- Reviewed and unchanged ?Social History  ? ?Tobacco Use  ? Smoking status: Some Days  ? Smokeless tobacco: Never  ?Substance Use Topics  ? Alcohol use: Yes  ?  Alcohol/week: 0.0 standard drinks  ?  Drug use: Yes  ?  Types: Marijuana  ? ? ? ?Names of Other Physician/Practitioners you currently use: ?1. [facility] here for primary care ?2. Eye Exam: Wear contacts, last exam 3/202 scheduled ?3. Dental Exam Due for 04/2021 ? ? ?Patient Care Team: ?Lucky Cowboy, MD as PCP - General (Internal Medicine) ? ? ? ?Screening Tests: ?Immunization History  ?Administered Date(s) Administered  ? HPV 9-valent 07/07/2018, 09/07/2018  ? HPV Quadrivalent 02/27/2010  ? PPD Test 01/24/2014, 03/08/2015, 07/07/2018  ? Tdap 02/01/2009, 08/29/2020  ? ? ? ?Vaccinations: ?TD or Tdap: 08/2020 ?Influenza: DUE for 2022  ?Pneumococcal: NA ?Prevnar13: NA ?Shingles: Zostavax/Shingrix: N/A ? ? ?Preventative Care: ?Last colonoscopy: N/A ? ?Review of Systems:  ?Review of Systems  ?Constitutional:  Negative for chills, diaphoresis, fever, malaise/fatigue and weight loss.  ?HENT:  Negative for congestion, ear discharge, ear pain, hearing loss, nosebleeds, sinus pain, sore throat and tinnitus.   ?Eyes:  Negative for blurred vision, double vision, photophobia, pain, discharge and redness.  ?Respiratory:   Negative for cough, hemoptysis, sputum production, shortness of breath, wheezing and stridor.   ?Cardiovascular:  Negative for chest pain, palpitations, orthopnea, claudication, leg swelling and PND.  ?Gastrointestinal

## 2022-04-17 NOTE — Patient Instructions (Signed)
Alcohol Abuse and Dependence Information, Adult Alcohol is a widely available drug. People drink alcohol in different amounts. People who drink alcohol very often and in large amounts often have problems during and after drinking. They may develop what is called an alcohol use disorder. There are two main types of alcohol use disorders: Alcohol abuse. This is when you use alcohol too much or too often. You may use alcohol to make yourself feel happy or to reduce stress. You may have a hard time setting a limit on the amount you drink. Alcohol dependence. This is when you use alcohol consistently for a period of time, and your body changes as a result. This can make it hard to stop drinking because you may start to feel sick or feel different when you do not use alcohol. These symptoms are known as withdrawal. How can alcohol abuse and dependence affect me? Alcohol abuse and dependence can have a negative effect on your life. Drinking too much can lead to addiction. You may feel like you need alcohol to function normally. You may drink alcohol before work in the morning, during the day, or as soon as you get home from work in the evening. These actions can result in: Poor work performance. Job loss. Financial problems. Car crashes or criminal charges from driving after drinking alcohol. Problems in your relationships with friends and family. Losing the trust and respect of coworkers, friends, and family. Drinking heavily over a long period of time can permanently damage your body and brain, and can cause lifelong health issues, such as: Damage to your liver or pancreas. Heart problems, high blood pressure, or stroke. Certain cancers. Decreased ability to fight infections. Brain or nerve damage. Depression. Early (premature) death. If you are careless or you crave alcohol, it is easy to drink more than your body can handle (overdose). Alcohol overdose is a serious situation that requires  hospitalization. It may lead to permanent injuries or death. What can increase my risk? Having a family history of alcohol abuse. Having depression or other mental health conditions. Beginning to drink at an early age. Binge drinking often. Experiencing trauma, stress, and an unstable home life during childhood. Spending time with people who drink often. What actions can I take to prevent or manage alcohol abuse and dependence? Do not drink alcohol if: Your health care provider tells you not to drink. You are pregnant, may be pregnant, or are planning to become pregnant. If you drink alcohol: Limit how much you use to: 0-1 drink a day for women. 0-2 drinks a day for men. Be aware of how much alcohol is in your drink. In the [place] one drink equals one 12 oz bottle of beer (355 mL), one 5 oz glass of wine (148 mL), or one 1 oz glass of hard liquor (44 mL). Stop drinking if you have been drinking too much. This can be very hard to do if you are used to abusing alcohol. If you begin to have withdrawal symptoms, talk with your health care provider or a person that you trust. These symptoms may include anxiety, shaky hands, headache, nausea, sweating, or not being able to sleep. Choose to drink nonalcoholic beverages in social gatherings and places where there may be alcohol. Activity Spend more time on activities that you enjoy that do not involve alcohol, like hobbies or exercise. Find healthy ways to cope with stress, such as exercise, meditation, or spending time with people you care about. General information Talk to your family,   coworkers, and friends about supporting you in your efforts to stop drinking. If they drink, ask them not to drink around you. Spend more time with people who do not drink alcohol. If you think that you have an alcohol dependency problem: Tell friends or family about your concerns. Talk with your health care provider or another health professional about where to  get help. Work with a therapist and a chemical dependency counselor. Consider joining a support group for people who struggle with alcohol abuse and dependence. Where to find support  Your health care provider. SMART Recovery: www.smartrecovery.org Therapy and support groups Local treatment centers or chemical dependency counselors. Local AA groups in your community: www.aa.org Where to find more information Centers for Disease Control and Prevention: www.cdc.gov National Institute on Alcohol Abuse and Alcoholism: www.niaaa.nih.gov Alcoholics Anonymous (AA): www.aa.org Contact a health care provider if: You drank more or for longer than you intended on more than one occasion. You tried to stop drinking or to cut back on how much you drink, but you were not able to. You often drink to the point of vomiting or passing out. You want to drink so badly that you cannot think about anything else. You have problems in your life due to drinking, but you continue to drink. You keep drinking even though you feel anxious, depressed, or have experienced memory loss. You have stopped doing the things you used to enjoy in order to drink. You have to drink more than you used to in order to get the effect you want. You experience anxiety, sweating, nausea, shakiness, and trouble sleeping when you try to stop drinking. Get help right away if: You have thoughts about hurting yourself or others. You have serious withdrawal symptoms, including: Confusion. Racing heart. High blood pressure. Fever. If you ever feel like you may hurt yourself or others, or have thoughts about taking your own life, get help right away. You can go to your nearest emergency department or call: Your local emergency services (911 in the [place] A suicide crisis helpline, such as the National Suicide Prevention Lifeline at 1-800-273-8255 or 988 in the [place] This is open 24 hours a day. Summary Alcohol abuse and dependence can  have a negative effect on your life. Drinking too much or too often can lead to addiction. If you drink alcohol, limit how much you use. If you are having trouble keeping your drinking under control, find ways to change your behavior. Hobbies, calming activities, exercise, or support groups can help. If you feel you need help with changing your drinking habits, talk with your health care provider, a good friend, or a therapist, or go to an AA group. This information is not intended to replace advice given to you by your health care provider. Make sure you discuss any questions you have with your health care provider. Document Revised: 10/19/2021 Document Reviewed: 02/02/2019 Elsevier Patient Education  2023 Elsevier Inc.  

## 2022-04-18 LAB — URINALYSIS W MICROSCOPIC + REFLEX CULTURE
Bacteria, UA: NONE SEEN /HPF
Bilirubin Urine: NEGATIVE
Glucose, UA: NEGATIVE
Hgb urine dipstick: NEGATIVE
Hyaline Cast: NONE SEEN /LPF
Ketones, ur: NEGATIVE
Leukocyte Esterase: NEGATIVE
Nitrites, Initial: NEGATIVE
Protein, ur: NEGATIVE
RBC / HPF: NONE SEEN /HPF (ref 0–2)
Specific Gravity, Urine: 1.021 (ref 1.001–1.035)
Squamous Epithelial / HPF: NONE SEEN /HPF (ref <=5)
WBC, UA: NONE SEEN /HPF (ref 0–5)
pH: 5.5 (ref 5.0–8.0)

## 2022-04-18 LAB — LIPID PANEL
Cholesterol: 180 mg/dL (ref <200)
HDL: 58 mg/dL (ref >=40)
LDL Cholesterol (Calc): 96 mg/dL (calc)
Non-HDL Cholesterol (Calc): 122 mg/dL (calc) (ref <130)
Total CHOL/HDL Ratio: 3.1 (calc) (ref <5.0)
Triglycerides: 162 mg/dL — ABNORMAL HIGH (ref <150)

## 2022-04-18 LAB — COMPLETE METABOLIC PANEL WITH GFR
AG Ratio: 2.3 (calc) (ref 1.0–2.5)
ALT: 33 U/L (ref 9–46)
AST: 21 U/L (ref 10–40)
Albumin: 4.3 g/dL (ref 3.6–5.1)
Alkaline phosphatase (APISO): 61 U/L (ref 36–130)
BUN: 11 mg/dL (ref 7–25)
CO2: 28 mmol/L (ref 20–32)
Calcium: 8.9 mg/dL (ref 8.6–10.3)
Chloride: 105 mmol/L (ref 98–110)
Creat: 0.74 mg/dL (ref 0.60–1.26)
Globulin: 1.9 g/dL (calc) (ref 1.9–3.7)
Glucose, Bld: 117 mg/dL — ABNORMAL HIGH (ref 65–99)
Potassium: 4 mmol/L (ref 3.5–5.3)
Sodium: 140 mmol/L (ref 135–146)
Total Bilirubin: 0.6 mg/dL (ref 0.2–1.2)
Total Protein: 6.2 g/dL (ref 6.1–8.1)
eGFR: 119 mL/min/{1.73_m2} (ref >=60)

## 2022-04-18 LAB — CBC WITH DIFFERENTIAL/PLATELET
Absolute Monocytes: 488 cells/uL (ref 200–950)
Basophils Absolute: 33 cells/uL (ref 0–200)
Basophils Relative: 0.5 %
Eosinophils Absolute: 119 cells/uL (ref 15–500)
Eosinophils Relative: 1.8 %
HCT: 43.2 % (ref 38.5–50.0)
Hemoglobin: 15.1 g/dL (ref 13.2–17.1)
Lymphs Abs: 1637 cells/uL (ref 850–3900)
MCH: 34.4 pg — ABNORMAL HIGH (ref 27.0–33.0)
MCHC: 35 g/dL (ref 32.0–36.0)
MCV: 98.4 fL (ref 80.0–100.0)
MPV: 10.5 fL (ref 7.5–12.5)
Monocytes Relative: 7.4 %
Neutro Abs: 4323 cells/uL (ref 1500–7800)
Neutrophils Relative %: 65.5 %
Platelets: 198 10*3/uL (ref 140–400)
RBC: 4.39 10*6/uL (ref 4.20–5.80)
RDW: 12.1 % (ref 11.0–15.0)
Total Lymphocyte: 24.8 %
WBC: 6.6 10*3/uL (ref 3.8–10.8)

## 2022-04-18 LAB — RPR: RPR Ser Ql: NONREACTIVE

## 2022-04-18 LAB — HEMOGLOBIN A1C
Hgb A1c MFr Bld: 4.8 % of total Hgb (ref <5.7)
Mean Plasma Glucose: 91 mg/dL
eAG (mmol/L): 5 mmol/L

## 2022-04-18 LAB — NO CULTURE INDICATED

## 2022-04-18 LAB — VITAMIN B12: Vitamin B-12: 342 pg/mL (ref 200–1100)

## 2022-04-18 LAB — VITAMIN D 25 HYDROXY (VIT D DEFICIENCY, FRACTURES): Vit D, 25-Hydroxy: 16 ng/mL — ABNORMAL LOW (ref 30–100)

## 2022-04-18 LAB — INSULIN, RANDOM: Insulin: 44.9 u[IU]/mL — ABNORMAL HIGH

## 2022-04-18 LAB — TSH: TSH: 1 mIU/L (ref 0.40–4.50)

## 2022-04-18 LAB — C. TRACHOMATIS/N. GONORRHOEAE RNA
C. trachomatis RNA, TMA: NOT DETECTED
N. gonorrhoeae RNA, TMA: NOT DETECTED

## 2022-04-18 LAB — HSV(HERPES SIMPLEX VRS) I + II AB-IGG
HAV 1 IGG,TYPE SPECIFIC AB: <0.9 index
HSV 2 IGG,TYPE SPECIFIC AB: <0.9 index

## 2022-04-18 LAB — HIV ANTIBODY (ROUTINE TESTING W REFLEX): HIV 1&2 Ab, 4th Generation: NONREACTIVE

## 2022-04-19 ENCOUNTER — Encounter: Payer: Self-pay | Admitting: Nurse Practitioner

## 2022-07-22 ENCOUNTER — Ambulatory Visit
Admission: EM | Admit: 2022-07-22 | Discharge: 2022-07-22 | Disposition: A | Discharge status: Home / Self Care | Payer: BC Managed Care – PPO | Attending: Urgent Care | Admitting: Urgent Care

## 2022-07-22 ENCOUNTER — Encounter: Payer: Self-pay | Admitting: Emergency Medicine

## 2022-07-22 ENCOUNTER — Ambulatory Visit (INDEPENDENT_AMBULATORY_CARE_PROVIDER_SITE_OTHER): Payer: BC Managed Care – PPO

## 2022-07-22 DIAGNOSIS — R509 Fever, unspecified: Secondary | ICD-10-CM | POA: Diagnosis not present

## 2022-07-22 DIAGNOSIS — R0981 Nasal congestion: Secondary | ICD-10-CM | POA: Diagnosis not present

## 2022-07-22 DIAGNOSIS — B349 Viral infection, unspecified: Secondary | ICD-10-CM | POA: Diagnosis not present

## 2022-07-22 DIAGNOSIS — R059 Cough, unspecified: Secondary | ICD-10-CM | POA: Diagnosis not present

## 2022-07-22 DIAGNOSIS — R051 Acute cough: Secondary | ICD-10-CM | POA: Diagnosis not present

## 2022-07-22 DIAGNOSIS — J019 Acute sinusitis, unspecified: Secondary | ICD-10-CM | POA: Diagnosis not present

## 2022-07-22 DIAGNOSIS — R070 Pain in throat: Secondary | ICD-10-CM | POA: Diagnosis not present

## 2022-07-22 DIAGNOSIS — R0902 Hypoxemia: Secondary | ICD-10-CM | POA: Insufficient documentation

## 2022-07-22 DIAGNOSIS — Z20822 Contact with and (suspected) exposure to covid-19: Secondary | ICD-10-CM | POA: Insufficient documentation

## 2022-07-22 MED ORDER — PROMETHAZINE-DM 6.25-15 MG/5ML PO SYRP: 5.0000 mL | ORAL_SOLUTION | Freq: Three times a day (TID) | ORAL | 0 refills | Status: DC | PRN

## 2022-07-22 MED ORDER — IPRATROPIUM BROMIDE 0.03 % NA SOLN: 2.0000 | Freq: Two times a day (BID) | NASAL | 0 refills | Status: DC

## 2022-07-22 MED ORDER — AMOXICILLIN 875 MG PO TABS: 875.0000 mg | ORAL_TABLET | Freq: Two times a day (BID) | ORAL | 0 refills | Status: DC

## 2022-07-22 MED ORDER — CETIRIZINE HCL 10 MG PO TABS
10.0000 mg | ORAL_TABLET | Freq: Every day | ORAL | 0 refills | Status: DC
Start: 2022-07-22 — End: 2023-03-19

## 2022-07-22 NOTE — Discharge Instructions (Signed)
Your COVID-19 testing is pending.  Technically, you do not qualify for COVID antivirals.  I am can manage you for a secondary sinus infection with amoxicillin.  Make sure you schedule Tylenol and ibuprofen.  Use Zyrtec as needed for your congestion.  You can also use the nose spray for this.  Make sure you finish the entire course of amoxicillin.

## 2022-07-22 NOTE — ED Triage Notes (Signed)
Pt here with cough x 1 week. 2 days ago he started with a fever, sore throat, night sweats, and some congestion.

## 2022-07-22 NOTE — ED Provider Notes (Signed)
[facility]   MRN: [id number] DOB: 03-14-1986  Subjective:   Louis Martinez is a 36 y.o. male presenting for 1 week history of persistent coughing, congestion.  Then last night he started to have a fever, throat pain, night sweats and worsening congestion.  No overt chest pain or shortness of breath.  No history of asthma or respiratory disorders.  Patient smokes on some days.  No chronic medications.    No Known Allergies  Past Medical History:  Diagnosis Date   Alcohol abuse 09/14/12   Patient does not desire to quit at this time   Hyperlipidemia    Vitamin D deficiency      Past Surgical History:  Procedure Laterality Date   APPENDECTOMY     36 year old    Family History  Problem Relation Age of Onset   Diabetes Mother    Hypertension Mother    Alcohol abuse Father    Cancer Maternal Grandmother        BREAST   Stroke Maternal Grandfather    Cancer Paternal Grandmother        lung    Social History   Tobacco Use   Smoking status: Some Days   Smokeless tobacco: Never  Substance Use Topics   Alcohol use: Yes    Alcohol/week: 0.0 standard drinks of alcohol   Drug use: Yes    Types: Marijuana    ROS   Objective:   Vitals: BP 121/85 (BP Location: Right Arm)   Pulse (!) 121   Temp 99.5 F (37.5 C) (Oral)   Resp 20   SpO2 95%   Pulse oximetry was 90% and occasionally dropped to 88-89%.  Physical Exam Constitutional:      General: He is not in acute distress.    Appearance: Normal appearance. He is well-developed. He is not ill-appearing, toxic-appearing or diaphoretic.  HENT:     Head: Normocephalic and atraumatic.     Right Ear: External ear normal.     Left Ear: External ear normal.     Nose: Nose normal.     Mouth/Throat:     Mouth: Mucous membranes are moist.  Eyes:     General: No scleral icterus.       Right eye: No discharge.        Left eye: No discharge.     Extraocular Movements: Extraocular movements  intact.  Cardiovascular:     Rate and Rhythm: Normal rate and regular rhythm.     Heart sounds: Normal heart sounds. No murmur heard.    No friction rub. No gallop.  Pulmonary:     Effort: Pulmonary effort is normal. No respiratory distress.     Breath sounds: Normal breath sounds. No stridor. No wheezing, rhonchi or rales.  Neurological:     Mental Status: He is alert and oriented to person, place, and time.  Psychiatric:        Mood and Affect: Mood normal.        Behavior: Behavior normal.        Thought Content: Thought content normal.     DG Chest 2 View  Result Date: 07/22/2022 CLINICAL DATA:  Cough.  Hypoxia. EXAM: CHEST - 2 VIEW COMPARISON:  None Available. FINDINGS: The heart size and mediastinal contours are within normal limits. Both lungs are clear. The visualized skeletal structures are unremarkable. IMPRESSION: No active cardiopulmonary disease. Electronically Signed   By: Signa Kell M.D.   On: 07/22/2022 16:52  Assessment and Plan :   PDMP not reviewed this encounter.  1. Acute non-recurrent sinusitis, unspecified location   2. Hypoxia   3. Acute viral syndrome   4. Acute cough   5. Sinus congestion   6. Fever, unspecified   7. Throat pain    High suspicion for COVID-19.  Patient should get treatment for secondary sinusitis with amoxicillin.  Emphasized monitoring for his oxygen levels with a pulse oximeter at home.  Use supportive care otherwise.  Otherwise, chest x-ray negative.  Provided with a note for work. Counseled patient on potential for adverse effects with medications prescribed/recommended today, ER and return-to-clinic precautions discussed, patient verbalized understanding.    Wallis Bamberg, [place] 07/22/22 1707

## 2022-07-23 LAB — SARS CORONAVIRUS 2 (TAT 6-24 HRS): SARS Coronavirus 2: NEGATIVE

## 2022-09-25 ENCOUNTER — Ambulatory Visit: Payer: BC Managed Care – PPO | Admitting: Nurse Practitioner

## 2022-09-25 ENCOUNTER — Encounter: Payer: Self-pay | Admitting: Nurse Practitioner

## 2022-09-25 VITALS — BP 118/80 | HR 66 | Temp 97.1°F | Ht 69.0 in | Wt 209.0 lb

## 2022-09-25 DIAGNOSIS — M722 Plantar fascial fibromatosis: Secondary | ICD-10-CM

## 2022-09-25 DIAGNOSIS — F410 Panic disorder [episodic paroxysmal anxiety] without agoraphobia: Secondary | ICD-10-CM

## 2022-09-25 DIAGNOSIS — F419 Anxiety disorder, unspecified: Secondary | ICD-10-CM | POA: Diagnosis not present

## 2022-09-25 DIAGNOSIS — Z79899 Other long term (current) drug therapy: Secondary | ICD-10-CM

## 2022-09-25 MED ORDER — BUSPIRONE HCL 10 MG PO TABS: ORAL_TABLET | ORAL | 0 refills | Status: DC

## 2022-09-25 NOTE — Patient Instructions (Signed)
[facility] [place], [place] [address]  ~4.7 mi [phone]  Plantar Fasciitis  Plantar fasciitis is a painful foot condition that affects the heel. It occurs when the band of tissue that connects the toes to the heel bone (plantar fascia) becomes irritated. This can happen as the result of exercising too much or doing other repetitive activities (overuse injury). Plantar fasciitis can cause mild irritation to severe pain that makes it difficult to walk or move. The pain is usually worse in the morning after sleeping, or after sitting or lying down for a period of time. Pain may also be worse after long periods of walking or standing. What are the causes? This condition may be caused by: Standing for long periods of time. Wearing shoes that do not have good arch support. Doing activities that put stress on joints (high-impact activities). This includes ballet and exercise that makes your heart beat faster (aerobic exercise), such as running. Being overweight. An abnormal way of walking (gait). Tight muscles in the back of your lower leg (calf). High arches in your feet or flat feet. Starting a new athletic activity. What are the signs or symptoms? The main symptom of this condition is heel pain. Pain may get worse after the following: Taking the first steps after a time of rest, especially in the morning after awakening, or after you have been sitting or lying down for a while. Long periods of standing still. Pain may decrease after 30-45 minutes of activity, such as gentle walking. How is this diagnosed? This condition may be diagnosed based on your medical history, a physical exam, and your symptoms. Your health care provider will check for: A tender area on the bottom of your foot. A high arch in your foot or flat feet. Pain when you move your foot. Difficulty moving your foot. You may have imaging tests to confirm the diagnosis, such  as: X-rays. Ultrasound. MRI. How is this treated? Treatment for plantar fasciitis depends on how severe your condition is. Treatment may include: Rest, ice, pressure (compression), and raising (elevating) the affected foot. This is called RICE therapy. Your health care provider may recommend RICE therapy along with over-the-counter pain medicines to manage your pain. Exercises to stretch your calves and your plantar fascia. A splint that holds your foot in a stretched, upward position while you sleep (night splint). Physical therapy to relieve symptoms and prevent problems in the future. Injections of steroid medicine (cortisone) to relieve pain and inflammation. Stimulating your plantar fascia with electrical impulses (extracorporeal shock wave therapy). This is usually the last treatment option before surgery. Surgery, if other treatments have not worked after 12 months. Follow these instructions at home: Managing pain, stiffness, and swelling  If directed, put ice on the painful area. To do this: Put ice in a plastic bag, or use a frozen bottle of water. Place a towel between your skin and the bag or bottle. Roll the bottom of your foot over the bag or bottle. Do this for 20 minutes, 2-3 times a day. Wear athletic shoes that have air-sole or gel-sole cushions, or try soft shoe inserts that are designed for plantar fasciitis. Elevate your foot above the level of your heart while you are sitting or lying down. Activity Avoid activities that cause pain. Ask your health care provider what activities are safe for you. Do physical therapy exercises and stretches as told by your health care provider. Try activities and forms of  exercise that are easier on your joints (low impact). Examples include swimming, water aerobics, and biking. General instructions Take over-the-counter and prescription medicines only as told by your health care provider. Wear a night splint while sleeping, if told by  your health care provider. Loosen the splint if your toes tingle, become numb, or turn cold and blue. Maintain a healthy weight, or work with your health care provider to lose weight as needed. Keep all follow-up visits. This is important. Contact a health care provider if you have: Symptoms that do not go away with home treatment. Pain that gets worse. Pain that affects your ability to move or do daily activities. Summary Plantar fasciitis is a painful foot condition that affects the heel. It occurs when the band of tissue that connects the toes to the heel bone (plantar fascia) becomes irritated. Heel pain is the main symptom of this condition. It may get worse after exercising too much or standing still for a long time. Treatment varies, but it usually starts with rest, ice, pressure (compression), and raising (elevating) the affected foot. This is called RICE therapy. Over-the-counter medicines can also be used to manage pain. This information is not intended to replace advice given to you by your health care provider. Make sure you discuss any questions you have with your health care provider. Document Revised: 03/13/2020 Document Reviewed: 03/13/2020 Elsevier Patient Education  2023 Elsevier Inc.  Plantar Fasciitis Rehab Ask your health care provider which exercises are safe for you. Do exercises exactly as told by your health care provider and adjust them as directed. It is normal to feel mild stretching, pulling, tightness, or discomfort as you do these exercises. Stop right away if you feel sudden pain or your pain gets worse. Do not begin these exercises until told by your health care provider. Stretching and range-of-motion exercises These exercises warm up your muscles and joints and improve the movement and flexibility of your foot. These exercises also help to relieve pain. Plantar fascia stretch  Sit with your left / right leg crossed over your opposite knee. Hold your heel with  one hand with that thumb near your arch. With your other hand, hold your toes and gently pull them back toward the top of your foot. You should feel a stretch on the base (bottom) of your toes, or the bottom of your foot (plantar fascia), or both. Hold this stretch for__________ seconds. Slowly release your toes and return to the starting position. Repeat __________ times. Complete this exercise __________ times a day. Gastrocnemius stretch, standing This exercise is also called a calf (gastroc) stretch. It stretches the muscles in the back of the upper calf. Stand with your hands against a wall. Extend your left / right leg behind you, and bend your front knee slightly. Keeping your heels on the floor, your toes facing forward, and your back knee straight, shift your weight toward the wall. Do not arch your back. You should feel a gentle stretch in your upper calf. Hold this position for __________ seconds. Repeat __________ times. Complete this exercise __________ times a day. Soleus stretch, standing This exercise is also called a calf (soleus) stretch. It stretches the muscles in the back of the lower calf. Stand with your hands against a wall. Extend your left / right leg behind you, and bend your front knee slightly. Keeping your heels on the floor and your toes facing forward, bend your back knee and shift your weight slightly over your back leg. You  should feel a gentle stretch deep in your lower calf. Hold this position for __________ seconds. Repeat __________ times. Complete this exercise __________ times a day. Gastroc and soleus stretch, standing step This exercise stretches the muscles in the back of the lower leg. These muscles are in the upper calf (gastrocnemius) and the lower calf (soleus). Stand with the ball of your left / right foot on the front of a step. The ball of your foot is on the walking surface, right under your toes. Keep your other foot firmly on the same  step. Hold on to the wall or a railing for balance. Slowly lift your other foot, allowing your body weight to press your heel down over the edge of the front of the step. Keep knee straight and unbent. You should feel a stretch in your calf. Hold this position for __________ seconds. Return both feet to the step. Repeat this exercise with a slight bend in your left / right knee. Repeat __________ times with your left / right knee straight and __________ times with your left / right knee bent. Complete this exercise __________ times a day. Balance exercise This exercise builds your balance and strength control of your arch to help take pressure off your plantar fascia. Single leg stand If this exercise is too easy, you can try it with your eyes closed or while standing on a pillow. Without shoes, stand near a railing or in a doorway. You may hold on to the railing or door frame as needed. Stand on your left / right foot. Keep your big toe down on the floor and lift the arch of your foot. You should feel a stretch across the bottom of your foot and your arch. Do not let your foot roll inward. Hold this position for __________ seconds. Repeat __________ times. Complete this exercise __________ times a day. This information is not intended to replace advice given to you by your health care provider. Make sure you discuss any questions you have with your health care provider. Document Revised: 09/07/2020 Document Reviewed: 09/07/2020 Elsevier Patient Education  2023 [employer].

## 2022-09-25 NOTE — Progress Notes (Signed)
Assessment and Plan:  Dorse Locy was seen today for an episodic visit.  Diagnoses and all order for this visit:  Plantar fasciitis Suggested visiting [facility]) for measurement of proper foot support. Discussed rehab/exercises. Xray, [place] or referral to Orthopedics if s/s fail to improve.   Anxiety/Panic Start Buspar  Declines therapy/CBT Recommended mindfulness meditation and exercise.   Insight-oriented psychotherapy given for 16 minutes exclusively. Psychoeducation:  encouraged personality growth wand development through coping techniques and problem-solving skills. Limit/Decrease/Monitor drug/alcohol intake.    Report to ER for any increase in extreme mood swings, HI/SI.  - busPIRone (BUSPAR) 10 MG tablet; Take 1 tablet TID.  Dispense: 90 tablet; Refill: 0  Medication management All medications discussed and reviewed in full. All questions and concerns regarding medications addressed.    Notify office for further evaluation and treatment, questions or concerns if s/s fail to improve. The risks and benefits of my recommendations, as well as other treatment options were discussed with the patient today. Questions were answered.  Follow up in 3-4 weeks.   Further disposition pending results of labs. Discussed med's effects and SE's.    Over 25 minutes of exam, counseling, chart review, and critical decision making was performed.   Future Appointments  Date Time Provider Department Center  04/18/2023 10:00 AM Yonatan Guitron, Archie Patten, NP [facility] None    ------------------------------------------------------------------------------------------------------------------   HPI BP 118/80   Pulse 66   Temp (!) 97.1 F (36.2 C)   Ht 5\' 9"  (1.753 m)   Wt 209 lb (94.8 kg)   SpO2 97%   BMI 30.86 kg/m   36 y.o.male presents for evaluation of BLE pain in feet.  Most notably in the mornings. Concentrated around the heels.  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  Has difficulty walking in the mornings due to pain.  Onset several months ago but worsening over time.   States that he has stopped drinking alcohol.  Has not had any use the month of October.  Continues to smoke marijuana but not daily several times a day.  Feels as though he is self medicating to numb the feelings, is trying to do better about feeling more aware and present in life.  He shares two children with an ex-partner that he reports causes him panic, stress.  Last week he experienced his first panic attack, felt he couldn't breath, as though he was going to die, crying.  Was able to calm himself down.  Continues to feel anxious, more so when around ex-partner.  Not much wile working during the day.  At times feels depressed, more in the  mornings, will have crying spells.  Does feel at times like he would be better of not here but more of wanting the situation to go away, not suicidal.  Does not have a plan or think about harming himself or others.  Denies HI/SI ideation.    Past Medical History:  Diagnosis Date   Alcohol abuse 09/14/12   Patient does not desire to quit at this time   Hyperlipidemia    Vitamin D deficiency      No Known Allergies  Current Outpatient Medications on File Prior to Visit  Medication Sig   amoxicillin (AMOXIL) 875 MG tablet Take 1 tablet (875 mg total) by mouth 2 (two) times daily.   cetirizine (ZYRTEC ALLERGY) 10 MG tablet Take 1 tablet (10 mg total) by mouth daily. (Patient not taking: Reported  on 09/25/2022)   ipratropium (ATROVENT) 0.03 % nasal spray Place 2 sprays into both nostrils 2 (two) times daily. (Patient not taking: Reported on 09/25/2022)   methylPREDNISolone (MEDROL DOSEPAK) 4 MG TBPK tablet Take 24 mg on day 1, 20 mg on day 2, 16 mg on day 3, 12 mg on day 4, 8 mg on day 5, 4 mg on day 6.   promethazine-dextromethorphan (PROMETHAZINE-DM) 6.25-15 MG/5ML syrup Take 5 mLs by mouth 3  (three) times daily as needed for cough.   No current facility-administered medications on file prior to visit.    ROS: all negative except what is noted in the HPI.   Physical Exam:  BP 118/80   Pulse 66   Temp (!) 97.1 F (36.2 C)   Ht 5\' 9"  (1.753 m)   Wt 209 lb (94.8 kg)   SpO2 97%   BMI 30.86 kg/m   General Appearance: NAD.  Awake, conversant and cooperative. Eyes: PERRLA, EOMs intact.  Sclera white.  Conjunctiva without erythema. Sinuses: No frontal/maxillary tenderness.  No nasal discharge. Nares patent.  ENT/Mouth: Ext aud canals clear.  Bilateral TMs w/DOL and without erythema or bulging. Hearing intact.  Posterior pharynx without swelling or exudate.  Tonsils without swelling or erythema.  Neck: Supple.  No masses, nodules or thyromegaly. Respiratory: Effort is regular with non-labored breathing. Breath sounds are equal bilaterally without rales, rhonchi, wheezing or stridor.  Cardio: RRR with no MRGs. Brisk peripheral pulses without edema.  Abdomen: Active BS in all four quadrants.  Soft and non-tender without guarding, rebound tenderness, hernias or masses. Lymphatics: Non tender without lymphadenopathy.  Musculoskeletal: Full ROM, 5/5 strength, normal ambulation.  No clubbing or cyanosis. Skin: Appropriate color for ethnicity. Warm without rashes, lesions, ecchymosis, ulcers.  Neuro: CN II-XII grossly normal. Normal muscle tone without cerebellar symptoms and intact sensation.   Psych: AO X 3,  appropriate mood and affect, insight and judgment.     Darrol Jump, NP 12:10 PM [facility]

## 2023-03-19 NOTE — Progress Notes (Unsigned)
     Future Appointments  Date Time Provider Department  03/20/2023 10:00 AM Lucky Cowboy, MD [facility]  04/18/2023 10:00 AM Adela Glimpse, NP [facility]    History of Present Illness:      Patient is a very nice  37 yo  single WM with hx/o elevated BP, HLD, Vit D Def, anxiety/ panic attacks  &  depression  &who presents now with       Current Outpatient Medications on File Prior to Visit  Medication Sig   busPIRone (BUSPAR) 10 MG tablet Take 1 tablet TID.     No Known Allergies   Problem list He has Vitamin D deficiency; Hyperlipidemia, mixed; Alcohol abuse; Hip flexor tightness; Abnormal glucose; and Elevated BP without diagnosis of hypertension on their problem list.   Observations/Objective:  There were no vitals taken for this visit.  HEENT - WNL. Neck - supple.  Chest - Clear equal BS. Cor - Nl HS. RRR w/o sig MGR. PP 1(+). No edema. MS- FROM w/o deformities.  Gait Nl. Neuro -  Nl w/o focal abnormalities.   Assessment and Plan:      Follow Up Instructions:        I discussed the assessment and treatment plan with the patient. The patient was provided an opportunity to ask questions and all were answered. The patient agreed with the plan and demonstrated an understanding of the instructions.       The patient was advised to call back or seek an in-person evaluation if the symptoms worsen or if the condition fails to improve as anticipated.    Marinus Maw, MD

## 2023-03-19 NOTE — Patient Instructions (Signed)

## 2023-03-20 ENCOUNTER — Encounter: Payer: Self-pay | Admitting: Internal Medicine

## 2023-03-20 ENCOUNTER — Ambulatory Visit (INDEPENDENT_AMBULATORY_CARE_PROVIDER_SITE_OTHER): Payer: 59 | Admitting: Internal Medicine

## 2023-03-20 VITALS — BP 122/80 | HR 74 | Temp 97.9°F | Resp 17 | Ht 69.0 in | Wt 208.0 lb

## 2023-03-20 DIAGNOSIS — F320 Major depressive disorder, single episode, mild: Secondary | ICD-10-CM

## 2023-03-20 DIAGNOSIS — F5101 Primary insomnia: Secondary | ICD-10-CM | POA: Diagnosis not present

## 2023-03-20 MED ORDER — ESCITALOPRAM OXALATE 20 MG PO TABS: ORAL_TABLET | ORAL | 1 refills | Status: DC

## 2023-03-20 MED ORDER — TRAZODONE HCL 150 MG PO TABS: ORAL_TABLET | ORAL | 0 refills | Status: DC

## 2023-04-18 ENCOUNTER — Encounter: Payer: 59 | Admitting: Nurse Practitioner

## 2023-05-07 ENCOUNTER — Ambulatory Visit (INDEPENDENT_AMBULATORY_CARE_PROVIDER_SITE_OTHER): Payer: 59 | Admitting: Nurse Practitioner

## 2023-05-07 ENCOUNTER — Encounter: Payer: Self-pay | Admitting: Nurse Practitioner

## 2023-05-07 VITALS — BP 98/68 | HR 59 | Temp 97.3°F | Ht 69.0 in | Wt 201.4 lb

## 2023-05-07 DIAGNOSIS — Z79899 Other long term (current) drug therapy: Secondary | ICD-10-CM

## 2023-05-07 DIAGNOSIS — E559 Vitamin D deficiency, unspecified: Secondary | ICD-10-CM

## 2023-05-07 DIAGNOSIS — Z1389 Encounter for screening for other disorder: Secondary | ICD-10-CM | POA: Diagnosis not present

## 2023-05-07 DIAGNOSIS — M5417 Radiculopathy, lumbosacral region: Secondary | ICD-10-CM

## 2023-05-07 DIAGNOSIS — Z0000 Encounter for general adult medical examination without abnormal findings: Secondary | ICD-10-CM

## 2023-05-07 DIAGNOSIS — Z113 Encounter for screening for infections with a predominantly sexual mode of transmission: Secondary | ICD-10-CM

## 2023-05-07 DIAGNOSIS — Z136 Encounter for screening for cardiovascular disorders: Secondary | ICD-10-CM | POA: Diagnosis not present

## 2023-05-07 DIAGNOSIS — Z0001 Encounter for general adult medical examination with abnormal findings: Secondary | ICD-10-CM

## 2023-05-07 DIAGNOSIS — Z1329 Encounter for screening for other suspected endocrine disorder: Secondary | ICD-10-CM | POA: Diagnosis not present

## 2023-05-07 DIAGNOSIS — E782 Mixed hyperlipidemia: Secondary | ICD-10-CM | POA: Diagnosis not present

## 2023-05-07 DIAGNOSIS — F1010 Alcohol abuse, uncomplicated: Secondary | ICD-10-CM | POA: Diagnosis not present

## 2023-05-07 DIAGNOSIS — Z131 Encounter for screening for diabetes mellitus: Secondary | ICD-10-CM | POA: Diagnosis not present

## 2023-05-07 DIAGNOSIS — R7309 Other abnormal glucose: Secondary | ICD-10-CM

## 2023-05-07 DIAGNOSIS — R030 Elevated blood-pressure reading, without diagnosis of hypertension: Secondary | ICD-10-CM

## 2023-05-07 DIAGNOSIS — F17200 Nicotine dependence, unspecified, uncomplicated: Secondary | ICD-10-CM

## 2023-05-07 DIAGNOSIS — R200 Anesthesia of skin: Secondary | ICD-10-CM

## 2023-05-07 NOTE — Patient Instructions (Signed)
Alcohol Misuse and Dependence Information, Adult Alcohol is a widely available drug and people choose to drink alcohol in different amounts. Alcohol misuse and dependence can have a negative effect on your life. Alcohol misuse is when you use alcohol too much or too often. You may have a hard time setting a limit on the amount you drink. Alcohol dependence is when you use alcohol consistently for a period of time, and your body changes as a result. Alcohol dependence can make it hard for you to stop drinking because you may start to feel sick or different when you do not drink alcohol. These symptoms are known as withdrawal. People who drink alcohol very often and in large amounts, may develop what is called an alcohol use disorder. How can alcohol misuse and dependence affect me? Drinking too much can lead to addiction. You may feel like you need alcohol to function normally. You may drink alcohol before work in the morning, during the day, or as soon as you get home from work in the evening. These actions can result in: Poor work performance. Job loss. Financial problems. Car crashes or criminal charges from driving after drinking alcohol. Problems in your relationships with friends and family. Losing the trust and respect of coworkers, friends, and family. Drinking heavily over a long period of time can permanently damage your body and brain, and can cause lifelong health issues, such as: Damage to your liver or pancreas. Heart problems, high blood pressure, or stroke. Certain cancers. Decreased ability to fight infections. Brain or nerve damage. Depression. Early death, also called premature death. If you are careless or you crave alcohol, it is easy to drink more than your body can handle (overdose). Alcohol overdose is a serious situation that requires hospitalization. It may lead to permanent injuries or death. What can increase my risk? Having a family history of alcohol misuse. Having  depression or other mental health conditions. Beginning to drink at an early age. Binge drinking often. Experiencing trauma, stress, and an unstable home life during childhood. Spending time with people who drink often. What actions can I take to prevent alcohol misuse and dependence? Do not drink alcohol if: Your health care provider tells you not to drink. You are pregnant, may be pregnant, or are planning to become pregnant. If you drink alcohol: Limit how much you have to: 0-1 drink a day for women who are not pregnant. 0-2 drinks a day for men. Know how much alcohol is in your drink. In the [place] one drink equals one 12 oz bottle of beer (355 mL), one 5 oz glass of wine (148 mL), or one 1 oz glass of hard liquor (44 mL). If you think you have an alcohol dependency problem, decide to stop drinking. This can be very hard to do if you are used to frequently drinking alcohol. If you begin to have withdrawal symptoms, talk with your health care provider or a person that you trust. These symptoms may include anxiety, shaky hands, headache, nausea, sweating, or not being able to sleep. Choose to drink nonalcoholic beverages in social gatherings and places where there may be alcohol. Activity Spend more time on activities that you enjoy that do not involve alcohol, like hobbies or exercise. Find healthy ways to cope with stress, such as meditation or spending time with people you care about. General information Talk to your family, coworkers, and friends about supporting you in your efforts to stop drinking. If they drink, ask them not to drink  around you. Spend more time with people who do not drink alcohol. If you think that you have an alcohol dependency problem: Tell friends or family about your concerns. Talk with your health care provider or another health professional about where to get help. Work with a Paramedic and a Network engineer. Consider joining a support group  for people who struggle with alcohol misuse and dependence. Where to find support  Your health care provider. SMART Recovery: smartrecovery.org Local treatment centers or chemical dependency counselors. Local AA groups in your community: CustomizedRugs.fi Where to find more information Centers for Disease Control and Prevention: TonerPromos.no [employer] on Alcohol Abuse and Alcoholism: BackupSupply.hu Alcoholics Anonymous (AA): CustomizedRugs.fi Contact a health care provider if: You drank more or for longer than you intended on more than one occasion. You often drink to the point of vomiting or passing out. You have problems in your life due to drinking, but you continue to drink. You keep drinking even though you feel anxious, depressed, or have experienced memory loss. You have stopped doing the things you used to enjoy in order to drink. You have to drink more than you used to in order to get the effect you want. You experience anxiety, sweating, nausea, shakiness, and trouble sleeping when you try to stop drinking. Get help right away if: You have serious withdrawal symptoms, including: Confusion. Racing heart. High blood pressure. Fever. These symptoms may be an emergency. Get help right away. Call 911. Do not wait to see if the symptoms will go away. Do not drive yourself to the hospital. Also, get help right away if: You have thoughts about hurting yourself or others. Take one of these steps if you feel like you may hurt yourself or others, or have thoughts about taking your own life: Call 911. Call the National Suicide Prevention Lifeline at 703-677-7233 or 988. This is open 24 hours a day. Text the Crisis Text Line at (563)032-9389. Summary Alcohol misuse and dependence can have a negative effect on your life. Drinking too much or too often can lead to addiction. If you drink alcohol, limit how much you use. If you are having trouble keeping your drinking under control, find ways to change your  behavior. Hobbies, calming activities, exercise, or support groups can help. If you feel you need help with changing your drinking habits, talk with your health care provider, a good friend, or a therapist, or go to a support group. This information is not intended to replace advice given to you by your health care provider. Make sure you discuss any questions you have with your health care provider. Document Revised: 01/30/2022 Document Reviewed: 01/30/2022 Elsevier Patient Education  2024 [employer].

## 2023-05-07 NOTE — Progress Notes (Signed)
COMPLETE PHYSICAL   Assessment and Plan:   Louis Martinez was seen today for annual exam.  Diagnoses and all orders for this visit:  Encounter for annual physical exam Due Annually Health maintenance reviewed   Elevated BP without diagnosis of hypertension Currently at goal without medication. Monitor blood pressure at home Call if consistently over 130/80 Discussed lifestyle modifications, weight loss, activity.  Reminder to go to the ER if any CP, SOB, nausea, dizziness, severe HA, changes vision/speech, left arm numbness and tingling and jaw pain.  Hyperlipidemia, mixed Discussed lifestyle modifications. Recommended diet heavy in fruits and veggies, omega 3's. Decrease consumption of animal meats, cheeses, and dairy products. Remain active and exercise as tolerated. Continue to monitor. Check lipids/TSH  Alcohol abuse Discussed importance of decreasing alcohol consumption  Discussed safety, self and child Continue to monitor  Vitamin D deficiency Continue supplement for goal of 60-100 Monitor Vitamin D levels  Abnormal glucose Education: Reviewed 'ABCs' of diabetes management  Discussed goals to be met and/or maintained include A1C (<7) Blood pressure (<130/80) Cholesterol (LDL <70) Continue Eye Exam yearly  Continue Dental Exam Q6 mo Discussed dietary recommendations Discussed Physical Activity recommendations Foot exam UTD Check A1C  Medication management All medications discussed and reviewed in full. All questions and concerns regarding medications addressed.    Screening, ischemic heart disease NSR   - EKG 12-Lead  Screening for thyroid disorder  - TSH  Screening for diabetes mellitus  - Hemoglobin A1c  Screening for blood or protein in urine  - Urinalysis w microscopic + reflex cultur  Routine screening for STI (sexually transmitted infection) Continue safe sex practices  - RPR - HIV Antibody (routine testing w rflx) - HSV(herpes simplex  vrs) 1+2 ab-IgG - C. trachomatis/N. gonorrhoeae RNA  Lumbosacral radiculopathy Currently controlled Discussed back/stretching exercises. Good shoe support.  Continue weight loss  Numbness and tingling Discussed decreasing alcohol intake.  Smoker Smoking cessation instruction/counseling given:  counseled patient on the dangers of tobacco use, advised patient to stop smoking, and reviewed strategies to maximize success counseled patient on the dangers of tobacco use, advised patient to stop smoking, and reviewed strategies to maximize success  Orders Placed This Encounter  Procedures   C. trachomatis/N. gonorrhoeae RNA   CBC with Differential/Platelet   COMPLETE METABOLIC PANEL WITH GFR   Lipid panel   TSH   Hemoglobin A1c   Insulin, random   VITAMIN D 25 Hydroxy (Vit-D Deficiency, Fractures)   Urinalysis, Routine w reflex microscopic   Microalbumin / creatinine urine ratio   Vitamin B12   RPR   HIV Antibody (routine testing w rflx)   HSV(herpes simplex vrs) 1+2 ab-IgG   EKG 12-Lead    Notify office for further evaluation and treatment, questions or concerns if any reported s/s fail to improve.   The patient was advised to call back or seek an in-person evaluation if any symptoms worsen or if the condition fails to improve as anticipated.   Further disposition pending results of labs. Discussed med's effects and SE's.    I discussed the assessment and treatment plan with the patient. The patient was provided an opportunity to ask questions and all were answered. The patient agreed with the plan and demonstrated an understanding of the instructions.  Discussed med's effects and SE's. Screening labs and tests as requested with regular follow-up as recommended.  I provided 35 minutes of face-to-face time during this encounter including counseling, chart review, and critical decision making was preformed.  Today's Plan of Care  is based on a patient-centered health care  approach known as shared decision making - the decisions, tests and treatments allow for patient preferences and values to be balanced with clinical evidence.    ----------------------------------------------------------------------------------------------------------------------  HPI 37 y.o. male  presents for complete physical and follow up on HTN, HLD, abnormal glucose with history of pre-diabetes, weight and vitamin D deficiency.   He has 2 children, Mimi 2.5 yo and Hand 1 yr old.  He is a single father and mother helps as needed.  Feels as though he has a good support system. Not currently not in relationship with children's mother.   He reports overall he is doing well today.  He denies any health concerns.    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  He is employed at a [employer].  Had noticed increase foot pain, numbness and tingling, gradually increasing over the years.  He went to [employer] and purchased new insoles which have helped.  Has not changed out his shoes in 2 years.    Was recently treated at [facility] 01/2022 for lower back pain flare.  Had prednisone and muscle relaxer which helped.  Evonnie Pat is now improved. Feels as though carrying around his children has help strengthen his back.  He continues to have poor dietary intake and zero exercise. He reports that he also drinks anywhere from 4-6 drink a night, liquor.  He also reports smoking marijuana on occasion.  He does not smoke tobacco or vape.  Discussed dietary changes and weight loss at length with patient. BMI is Body mass index is 29.74 kg/m., he has been working on diet, trying to make better food choices and exercise.   Wt Readings from Last 3 Encounters:  05/07/23 201 lb 6.4 oz (91.4 kg)  03/20/23 208 lb (94.3 kg)  09/25/22 209 lb (94.8 kg)    His blood pressure has been controlled at home, today their BP is BP: 98/68  He does workout. He denies any cardiac symptoms,  chest pains, palpitations, shortness of breath, dizziness or lower extremity edema.      He is not on cholesterol medication   His cholesterol is not at goal. The cholesterol last visit was:   Lab Results  Component Value Date   CHOL 180 04/17/2022   HDL 58 04/17/2022   LDLCALC 96 04/17/2022   TRIG 162 (H) 04/17/2022   CHOLHDL 3.1 04/17/2022    He has not been working on diet and exercise for prediabetes. Last A1C in the office was:  Lab Results  Component Value Date   HGBA1C 4.8 04/17/2022   Patient does not have history of CKD.  Their last GFR was:  Lab Results  Component Value Date   [facility] 124 03/13/2021   Lab Results  Component Value Date   GFRAA 143 03/13/2021   Patient is not on Vitamin D supplement for deficiency.  Last Vitamin D was not at goal. Lab Results  Component Value Date   VD25OH 16 (L) 04/17/2022      Current Medications:  Current Outpatient Medications on File Prior to Visit  Medication Sig   escitalopram (LEXAPRO) 20 MG tablet Take 1 tablet Daily for Mood   busPIRone (BUSPAR) 10 MG tablet Take 1 tablet TID. (Patient not taking: Reported on 03/20/2023)   traZODone (DESYREL) 150 MG tablet Take  1/2 to 1 tablet  1 to 2 hours before Bedtime as needed  for Sleep (Patient not taking: Reported on 05/07/2023)   No current facility-administered medications on file prior to visit.    Allergies:  Not on File    Medical History:  Past Medical History:  Diagnosis Date   Alcohol abuse 09/14/12   Patient does not desire to quit at this time   Hyperlipidemia    Vitamin D deficiency      Family history- Reviewed and unchanged Family History  Problem Relation Age of Onset   Diabetes Mother    Hypertension Mother    Alcohol abuse Father    Cancer Maternal Grandmother        BREAST   Stroke Maternal Grandfather    Cancer Paternal Grandmother        lung     Social history- Reviewed and unchanged Social History   Tobacco Use   Smoking status:  Some Days   Smokeless tobacco: Never  Substance Use Topics   Alcohol use: Yes    Alcohol/week: 0.0 standard drinks of alcohol   Drug use: Yes    Types: Marijuana     Names of Other Physician/Practitioners you currently use: 1. [facility] here for primary care 2. Eye Exam: Wear contacts, last exam 02/2022 scheduled 3. Dental Exam Due for 04/2022   Patient Care Team: Lucky Cowboy, MD as PCP - General (Internal Medicine)    Screening Tests: Immunization History  Administered Date(s) Administered   HPV 9-valent 07/07/2018, 09/07/2018   HPV Quadrivalent 02/27/2010   PPD Test 01/24/2014, 03/08/2015, 07/07/2018   Tdap 02/01/2009, 08/29/2020     Vaccinations: TD or Tdap: 08/2020 Influenza: 2023  Pneumococcal: NA Prevnar13: NA Shingles: Zostavax/Shingrix: N/A   Preventative Care: Last colonoscopy: N/A  Review of Systems:  Review of Systems  Constitutional:  Negative for chills, diaphoresis, fever, malaise/fatigue and weight loss.  HENT:  Negative for congestion, ear discharge, ear pain, hearing loss, nosebleeds, sinus pain, sore throat and tinnitus.   Eyes:  Negative for blurred vision, double vision, photophobia, pain, discharge and redness.  Respiratory:  Negative for cough, hemoptysis, sputum production, shortness of breath, wheezing and stridor.   Cardiovascular:  Negative for chest pain, palpitations, orthopnea, claudication, leg swelling and PND.  Gastrointestinal:  Negative for abdominal pain, blood in stool, constipation, diarrhea, heartburn, melena, nausea and vomiting.  Genitourinary:  Negative for dysuria, flank pain, frequency, hematuria and urgency.  Musculoskeletal:  Positive for back pain. Negative for falls, joint pain, myalgias and neck pain.       BLE foot pain  Skin:  Negative for itching and rash.  Neurological:  Negative for dizziness, tingling, tremors, sensory change, speech change, focal weakness, seizures,  loss of consciousness, weakness and headaches.  Endo/Heme/Allergies:  Negative for environmental allergies and polydipsia. Does not bruise/bleed easily.  Psychiatric/Behavioral:  Negative for depression, hallucinations, memory loss, substance abuse and suicidal ideas. The patient is not nervous/anxious and does not have insomnia.     Physical Exam: BP 98/68   Pulse (!) 59   Temp (!) 97.3 F (36.3 C)   Ht 5\' 9"  (1.753 m)   Wt 201 lb 6.4 oz (91.4 kg)   SpO2 97%   BMI 29.74 kg/m  Wt Readings from Last 3 Encounters:  05/07/23 201 lb 6.4 oz (91.4 kg)  03/20/23 208 lb (94.3 kg)  09/25/22 209 lb (94.8 kg)   General Appearance: Well nourished, in no apparent distress. Eyes: PERRLA, EOMs, conjunctiva no swelling or erythema Sinuses: No Frontal/maxillary tenderness ENT/Mouth: Ext aud canals  clear, TMs without erythema, bulging. No erythema, swelling, or exudate on post pharynx.  Tonsils not swollen or erythematous. Hearing normal.  Neck: Supple, thyroid normal.  Respiratory: Respiratory effort normal, BS equal bilaterally without rales, rhonchi, wheezing or stridor.  Cardio: RRR with no MRGs. Brisk peripheral pulses without edema.  Abdomen: Soft,obese, + BS.  Non tender, no guarding, rebound, hernias, masses. Lymphatics: Non tender without lymphadenopathy.  Musculoskeletal: Full ROM, 5/5 strength, Normal gait Skin: Warm, dry without rashes, lesions, ecchymosis.  Neuro: Cranial nerves intact. No cerebellar symptoms.  Psych: Awake and oriented X 3, affect flat, Insight and Judgment appropriate.     Adela Glimpse, NP-C [facility] 05/07/2023  10:15 AM

## 2023-05-08 LAB — CBC WITH DIFFERENTIAL/PLATELET
Absolute Monocytes: 408 cells/uL (ref 200–950)
Basophils Absolute: 27 cells/uL (ref 0–200)
Basophils Relative: 0.4 %
Eosinophils Absolute: 102 cells/uL (ref 15–500)
Eosinophils Relative: 1.5 %
HCT: 42.7 % (ref 38.5–50.0)
Hemoglobin: 14.7 g/dL (ref 13.2–17.1)
Lymphs Abs: 1292 cells/uL (ref 850–3900)
MCH: 33.3 pg — ABNORMAL HIGH (ref 27.0–33.0)
MCHC: 34.4 g/dL (ref 32.0–36.0)
MCV: 96.6 fL (ref 80.0–100.0)
MPV: 10.1 fL (ref 7.5–12.5)
Monocytes Relative: 6 %
Neutro Abs: 4971 cells/uL (ref 1500–7800)
Neutrophils Relative %: 73.1 %
Platelets: 203 10*3/uL (ref 140–400)
RBC: 4.42 10*6/uL (ref 4.20–5.80)
RDW: 12.3 % (ref 11.0–15.0)
Total Lymphocyte: 19 %
WBC: 6.8 10*3/uL (ref 3.8–10.8)

## 2023-05-08 LAB — COMPLETE METABOLIC PANEL WITH GFR
AG Ratio: 2 (calc) (ref 1.0–2.5)
ALT: 41 U/L (ref 9–46)
AST: 25 U/L (ref 10–40)
Albumin: 4.3 g/dL (ref 3.6–5.1)
Alkaline phosphatase (APISO): 71 U/L (ref 36–130)
BUN: 11 mg/dL (ref 7–25)
CO2: 28 mmol/L (ref 20–32)
Calcium: 9.1 mg/dL (ref 8.6–10.3)
Chloride: 105 mmol/L (ref 98–110)
Creat: 0.7 mg/dL (ref 0.60–1.26)
Globulin: 2.1 g/dL (calc) (ref 1.9–3.7)
Glucose, Bld: 97 mg/dL (ref 65–99)
Potassium: 4 mmol/L (ref 3.5–5.3)
Sodium: 140 mmol/L (ref 135–146)
Total Bilirubin: 1 mg/dL (ref 0.2–1.2)
Total Protein: 6.4 g/dL (ref 6.1–8.1)
eGFR: 120 mL/min/{1.73_m2} (ref >=60)

## 2023-05-08 LAB — URINALYSIS, ROUTINE W REFLEX MICROSCOPIC
Bilirubin Urine: NEGATIVE
Glucose, UA: NEGATIVE
Hgb urine dipstick: NEGATIVE
Ketones, ur: NEGATIVE
Leukocytes,Ua: NEGATIVE
Nitrite: NEGATIVE
Protein, ur: NEGATIVE
Specific Gravity, Urine: 1.021 (ref 1.001–1.035)
pH: 6.5 (ref 5.0–8.0)

## 2023-05-08 LAB — MICROALBUMIN / CREATININE URINE RATIO
Creatinine, Urine: 129 mg/dL (ref 20–320)
Microalb Creat Ratio: 2 mg/g creat (ref <30)
Microalb, Ur: 0.2 mg/dL

## 2023-05-08 LAB — HSV(HERPES SIMPLEX VRS) I + II AB-IGG
HAV 1 IGG,TYPE SPECIFIC AB: <0.9 index
HSV 2 IGG,TYPE SPECIFIC AB: <0.9 index

## 2023-05-08 LAB — C. TRACHOMATIS/N. GONORRHOEAE RNA
C. trachomatis RNA, TMA: NOT DETECTED
N. gonorrhoeae RNA, TMA: NOT DETECTED

## 2023-05-08 LAB — HEMOGLOBIN A1C
Hgb A1c MFr Bld: 4.9 % of total Hgb (ref <5.7)
Mean Plasma Glucose: 94 mg/dL
eAG (mmol/L): 5.2 mmol/L

## 2023-05-08 LAB — LIPID PANEL
Cholesterol: 194 mg/dL (ref <200)
HDL: 71 mg/dL (ref >=40)
LDL Cholesterol (Calc): 102 mg/dL (calc) — ABNORMAL HIGH
Non-HDL Cholesterol (Calc): 123 mg/dL (calc) (ref <130)
Total CHOL/HDL Ratio: 2.7 (calc) (ref <5.0)
Triglycerides: 109 mg/dL (ref <150)

## 2023-05-08 LAB — VITAMIN D 25 HYDROXY (VIT D DEFICIENCY, FRACTURES): Vit D, 25-Hydroxy: 26 ng/mL — ABNORMAL LOW (ref 30–100)

## 2023-05-08 LAB — RPR: RPR Ser Ql: NONREACTIVE

## 2023-05-08 LAB — HIV ANTIBODY (ROUTINE TESTING W REFLEX): HIV 1&2 Ab, 4th Generation: NONREACTIVE

## 2023-05-08 LAB — TSH: TSH: 0.98 mIU/L (ref 0.40–4.50)

## 2023-05-08 LAB — INSULIN, RANDOM: Insulin: 7 u[IU]/mL

## 2023-05-08 LAB — VITAMIN B12: Vitamin B-12: 401 pg/mL (ref 200–1100)

## 2023-09-04 ENCOUNTER — Ambulatory Visit
Admission: RE | Admit: 2023-09-04 | Discharge: 2023-09-04 | Disposition: A | Discharge status: Home / Self Care | Payer: 59 | Source: Ambulatory Visit | Attending: Internal Medicine | Admitting: Internal Medicine

## 2023-09-04 VITALS — BP 127/83 | HR 92 | Temp 97.7°F | Resp 16

## 2023-09-04 DIAGNOSIS — M545 Low back pain, unspecified: Secondary | ICD-10-CM

## 2023-09-04 DIAGNOSIS — S39012A Strain of muscle, fascia and tendon of lower back, initial encounter: Secondary | ICD-10-CM

## 2023-09-04 MED ORDER — CYCLOBENZAPRINE HCL 10 MG PO TABS: 10.0000 mg | ORAL_TABLET | Freq: Two times a day (BID) | ORAL | 0 refills | Status: DC | PRN

## 2023-09-04 MED ORDER — KETOROLAC TROMETHAMINE 30 MG/ML IJ SOLN
30.0000 mg | Freq: Once | INTRAMUSCULAR | Status: AC
  Administered 2023-09-04: 30 mg via INTRAMUSCULAR

## 2023-09-04 NOTE — ED Provider Notes (Signed)
[facility]    CSN: [id number] Arrival date & time: 09/04/23  0905      History   Chief Complaint Chief Complaint  Patient presents with   Back Pain    Entered by patient    HPI Louis Martinez is a 37 y.o. male presents for back pain.  Patient reports 2 days ago he sneezed/coughed causing pain in his lower back that has been persistent since that time and worse with movement.  Denies any numbness/tingling/weakness of his lower extremities, no bowel or bladder incontinence, no saddle paresthesia.  Denies any radiation of the pain.  Does report history of low back pain that typically occurs about twice a year.  No history of back surgeries.  Took ibuprofen yesterday but has not taken any medications today.  No other concerns at this time.   Back Pain   Past Medical History:  Diagnosis Date   Alcohol abuse 09/14/12   Patient does not desire to quit at this time   Hyperlipidemia    Vitamin D deficiency     Patient Active Problem List   Diagnosis Date Noted   Abnormal glucose 09/27/2019   Elevated BP without diagnosis of hypertension 09/27/2019   Hip flexor tightness 05/26/2018   Hyperlipidemia, mixed    Vitamin D deficiency    Alcohol abuse 09/14/2012    Past Surgical History:  Procedure Laterality Date   APPENDECTOMY     37 year old       Home Medications    Prior to Admission medications   Medication Sig Start Date End Date Taking? Authorizing Provider  cyclobenzaprine (FLEXERIL) 10 MG tablet Take 1 tablet (10 mg total) by mouth 2 (two) times daily as needed for muscle spasms. 09/04/23  Yes Radford Pax, NP  busPIRone (BUSPAR) 10 MG tablet Take 1 tablet TID. Patient not taking: Reported on 03/20/2023 09/25/22   Adela Glimpse, NP  escitalopram (LEXAPRO) 20 MG tablet Take 1 tablet Daily for Mood 03/20/23   Lucky Cowboy, MD  traZODone (DESYREL) 150 MG tablet Take  1/2 to 1 tablet  1 to 2 hours before Bedtime as needed for Sleep Patient not taking:  Reported on 05/07/2023 03/20/23   Lucky Cowboy, MD    Family History Family History  Problem Relation Age of Onset   Diabetes Mother    Hypertension Mother    Alcohol abuse Father    Cancer Maternal Grandmother        BREAST   Stroke Maternal Grandfather    Cancer Paternal Grandmother        lung    Social History Social History   Tobacco Use   Smoking status: Some Days   Smokeless tobacco: Never  Substance Use Topics   Alcohol use: Yes    Alcohol/week: 0.0 standard drinks of alcohol   Drug use: Yes    Types: Marijuana     Allergies   Patient has no known allergies.   Review of Systems Review of Systems  Musculoskeletal:  Positive for back pain.     Physical Exam Triage Vital Signs ED Triage Vitals  Encounter Vitals Group     BP 09/04/23 0913 127/83     Systolic BP Percentile --      Diastolic BP Percentile --      Pulse Rate 09/04/23 0913 92     Resp 09/04/23 0913 16     Temp 09/04/23 0913 97.7 F (36.5 C)     Temp Source 09/04/23 0913 Oral  SpO2 09/04/23 0913 93 %     Weight --      Height --      Head Circumference --      Peak Flow --      Pain Score 09/04/23 0919 4     Pain Loc --      Pain Education --      Exclude from Growth Chart --    No data found.  Updated Vital Signs BP 127/83 (BP Location: Right Arm)   Pulse 92   Temp 97.7 F (36.5 C) (Oral)   Resp 16   SpO2 93%   Visual Acuity Right Eye Distance:   Left Eye Distance:   Bilateral Distance:    Right Eye Near:   Left Eye Near:    Bilateral Near:     Physical Exam Vitals and nursing note reviewed.  Constitutional:      General: He is not in acute distress.    Appearance: Normal appearance. He is not ill-appearing.  HENT:     Head: Normocephalic and atraumatic.  Eyes:     Pupils: Pupils are equal, round, and reactive to light.  Cardiovascular:     Rate and Rhythm: Normal rate.  Pulmonary:     Effort: Pulmonary effort is normal.  Musculoskeletal:     Thoracic  back: Normal.     Lumbar back: Spasms and tenderness present. No swelling, edema, deformity, signs of trauma, lacerations or bony tenderness. Normal range of motion. Negative right straight leg raise test and negative left straight leg raise test. No scoliosis.       Back:     Comments: Strength 5 out of 5 bilateral lower extremities  Skin:    General: Skin is warm and dry.  Neurological:     General: No focal deficit present.     Mental Status: He is alert and oriented to person, place, and time.  Psychiatric:        Mood and Affect: Mood normal.        Behavior: Behavior normal.      UC Treatments / Results  Labs (all labs ordered are listed, but only abnormal results are displayed) Labs Reviewed - No data to display COMPLETE METABOLIC PANEL WITH GFR Order: [id number] Status: Final result     Visible to patient: Yes (seen)     Next appt: 05/06/2024 at 10:00 AM in Internal Medicine Adela Glimpse, NP)     Dx: Alcohol abuse; Medication management;...   0 Result Notes     1 Patient Communication          Component Ref Range & Units 4 mo ago (05/07/23) 1 yr ago (04/17/22) 2 yr ago (03/13/21) 3 yr ago (08/29/20) 5 yr ago (07/07/18) 6 yr ago (03/11/17) 6 yr ago (03/11/17)  Glucose, Bld 65 - 99 mg/dL 97 409 High  CM 97 CM 811 High  CM 105 High  CM  100 High   Comment: .            Fasting reference interval .  BUN 7 - 25 mg/dL 11 11 11 8 10  12   Creat 0.60 - 1.26 mg/dL 9.14 7.82 9.56 R 2.13 R 0.84 R  0.80 R  eGFR > OR = 60 mL/min/1.5m2 120 119 CM       BUN/Creatinine Ratio 6 - 22 (calc) SEE NOTE: NOT APPLICABLE NOT APPLICABLE NOT APPLICABLE NOT APPLICABLE    Comment:    Not Reported: BUN and Creatinine are within  reference range. .  Sodium 135 - 146 mmol/L 140 140 140 140 139  141  Potassium 3.5 - 5.3 mmol/L 4.0 4.0 4.4 4.1 4.6  3.9  Chloride 98 - 110 mmol/L 105 105 106 105 103  106  CO2 20 - 32 mmol/L 28 28 26 26 28  25  R  Calcium 8.6 - 10.3 mg/dL 9.1 8.9 9.2  9.2 9.5  9.3  Total Protein 6.1 - 8.1 g/dL 6.4 6.2 6.7 6.8 6.8 6.5   Albumin 3.6 - 5.1 g/dL 4.3 4.3 4.4 4.6 4.5    Globulin 1.9 - 3.7 g/dL (calc) 2.1 1.9 2.3 2.2 2.3    AG Ratio 1.0 - 2.5 (calc) 2.0 2.3 1.9 2.1 2.0    Total Bilirubin 0.2 - 1.2 mg/dL 1.0 0.6 0.6 0.9 0.8 1.0   Alkaline phosphatase (APISO) 36 - 130 U/L 71 61 78 77 77 R    AST 10 - 40 U/L 25 21 33 33 29 26   ALT 9 - 46 U/L 41 33 73 High  54 High  53 High  44   Resulting Agency [facility]         Resulting Agency's Comment  Performing Organization Information:     Site IDGweneth Fritter (CLIA: [id number])     Name: Tawni Millers     Address: [address] [place], [place] [address]     Director: Marica Otter MD    Specimen Collected: 05/07/23 10:50 Last Resulted: 05/08/23 20:36         EKG   Radiology No results found.  Procedures Procedures (including critical care time)  Medications Ordered in UC Medications  ketorolac (TORADOL) 30 MG/ML injection 30 mg (has no administration in time range)    Initial Impression / Assessment and Plan / UC Course  I have reviewed the triage vital signs and the nursing notes.  Pertinent labs & imaging results that were available during my care of the patient were reviewed by me and considered in my medical decision making (see chart for details).     Reviewed exam and symptoms with patient.  No red flags.  Discussed low back strain.  Toradol IM given in clinic and patient monitored for 10 minutes after injection with no reaction noted and tolerated well.  Instructed no NSAIDs for 24 hours and he verbalized understanding.  Trial of Flexeril, side effect profile reviewed.  Heat to the back as needed.  PCP follow-up if symptoms do not improve.  ER precautions reviewed and patient verbalized understanding. Final Clinical Impressions(s) / UC Diagnoses   Final diagnoses:  Low  back strain, initial encounter  Acute bilateral low back pain without sciatica     Discharge Instructions      You were given a Toradol injection in clinic today. Do not take any over the counter NSAID's such as Advil, ibuprofen, Aleve, or naproxen for 24 hours.  You may take tylenol if needed Trial of Flexeril as needed.  Presents medication make you drowsy.  Do not drink alcohol or drive on this medication.  Heat to the back and rest.  Follow-up with your PCP if your symptoms do not improve.  Please go directly to develop any worsening symptoms.  I hope you feel better soon!     ED Prescriptions     Medication Sig Dispense Auth. Provider   cyclobenzaprine (FLEXERIL) 10 MG tablet Take 1 tablet (10 mg total) by mouth 2 (two) times daily  as needed for muscle spasms. 10 tablet Radford Pax, NP      PDMP not reviewed this encounter.   Radford Pax, NP 09/04/23 [phone]

## 2023-09-04 NOTE — Discharge Instructions (Signed)
You were given a Toradol injection in clinic today. Do not take any over the counter NSAID's such as Advil, ibuprofen, Aleve, or naproxen for 24 hours.  You may take tylenol if needed Trial of Flexeril as needed.  Presents medication make you drowsy.  Do not drink alcohol or drive on this medication.  Heat to the back and rest.  Follow-up with your PCP if your symptoms do not improve.  Please go directly to develop any worsening symptoms.  I hope you feel better soon!

## 2023-09-04 NOTE — ED Triage Notes (Signed)
Pt presents with c/o lower back pain x 2 days. States it started when he coughed.   States he has had been having allergies for a few wks. Has had a productive cough.   Home interventions: otc cough medicine

## 2023-12-11 ENCOUNTER — Other Ambulatory Visit: Payer: Self-pay | Admitting: Internal Medicine

## 2023-12-11 DIAGNOSIS — F320 Major depressive disorder, single episode, mild: Secondary | ICD-10-CM

## 2024-04-06 DIAGNOSIS — J039 Acute tonsillitis, unspecified: Secondary | ICD-10-CM | POA: Diagnosis not present

## 2024-04-18 DIAGNOSIS — M5442 Lumbago with sciatica, left side: Secondary | ICD-10-CM | POA: Diagnosis not present

## 2024-04-20 ENCOUNTER — Ambulatory Visit (HOSPITAL_COMMUNITY)
Admission: EM | Admit: 2024-04-20 | Discharge: 2024-04-21 | Disposition: A | Discharge status: Other Acute Inpatient Hospital | Attending: Nurse Practitioner | Admitting: Nurse Practitioner

## 2024-04-20 ENCOUNTER — Other Ambulatory Visit: Payer: Self-pay

## 2024-04-20 DIAGNOSIS — F1094 Alcohol use, unspecified with alcohol-induced mood disorder: Secondary | ICD-10-CM | POA: Diagnosis present

## 2024-04-20 DIAGNOSIS — F1210 Cannabis abuse, uncomplicated: Secondary | ICD-10-CM | POA: Insufficient documentation

## 2024-04-20 DIAGNOSIS — F1090 Alcohol use, unspecified, uncomplicated: Secondary | ICD-10-CM

## 2024-04-20 DIAGNOSIS — F331 Major depressive disorder, recurrent, moderate: Secondary | ICD-10-CM | POA: Insufficient documentation

## 2024-04-20 DIAGNOSIS — F1994 Other psychoactive substance use, unspecified with psychoactive substance-induced mood disorder: Secondary | ICD-10-CM

## 2024-04-20 DIAGNOSIS — F1010 Alcohol abuse, uncomplicated: Secondary | ICD-10-CM | POA: Diagnosis not present

## 2024-04-20 DIAGNOSIS — F411 Generalized anxiety disorder: Secondary | ICD-10-CM | POA: Diagnosis not present

## 2024-04-20 LAB — CBC WITH DIFFERENTIAL/PLATELET
Abs Immature Granulocytes: 0.14 10*3/uL — ABNORMAL HIGH (ref 0.00–0.07)
Basophils Absolute: 0 10*3/uL (ref 0.0–0.1)
Basophils Relative: 0 %
Eosinophils Absolute: 0 10*3/uL (ref 0.0–0.5)
Eosinophils Relative: 0 %
HCT: 45.3 % (ref 39.0–52.0)
Hemoglobin: 16.2 g/dL (ref 13.0–17.0)
Immature Granulocytes: 1 %
Lymphocytes Relative: 6 %
Lymphs Abs: 1 10*3/uL (ref 0.7–4.0)
MCH: 34 pg (ref 26.0–34.0)
MCHC: 35.8 g/dL (ref 30.0–36.0)
MCV: 95.2 fL (ref 80.0–100.0)
Monocytes Absolute: 0.6 10*3/uL (ref 0.1–1.0)
Monocytes Relative: 3 %
Neutro Abs: 16.5 10*3/uL — ABNORMAL HIGH (ref 1.7–7.7)
Neutrophils Relative %: 90 %
Platelets: 303 10*3/uL (ref 150–400)
RBC: 4.76 MIL/uL (ref 4.22–5.81)
RDW: 12.1 % (ref 11.5–15.5)
WBC: 18.2 10*3/uL — ABNORMAL HIGH (ref 4.0–10.5)
nRBC: 0 % (ref 0.0–0.2)

## 2024-04-20 LAB — LIPID PANEL
Cholesterol: 252 mg/dL — ABNORMAL HIGH (ref 0–200)
HDL: 60 mg/dL (ref >40)
LDL Cholesterol: 148 mg/dL — ABNORMAL HIGH (ref 0–99)
Total CHOL/HDL Ratio: 4.2 ratio
Triglycerides: 220 mg/dL — ABNORMAL HIGH (ref <150)
VLDL: 44 mg/dL — ABNORMAL HIGH (ref 0–40)

## 2024-04-20 LAB — POCT URINE DRUG SCREEN - MANUAL ENTRY (I-SCREEN)
POC Amphetamine UR: NOT DETECTED
POC Buprenorphine (BUP): NOT DETECTED
POC Cocaine UR: NOT DETECTED
POC Marijuana UR: POSITIVE — AB
POC Methadone UR: NOT DETECTED
POC Methamphetamine UR: NOT DETECTED
POC Morphine: NOT DETECTED
POC Oxazepam (BZO): NOT DETECTED
POC Oxycodone UR: POSITIVE — AB
POC Secobarbital (BAR): NOT DETECTED

## 2024-04-20 LAB — COMPREHENSIVE METABOLIC PANEL WITH GFR
ALT: 45 U/L — ABNORMAL HIGH (ref 0–44)
AST: 30 U/L (ref 15–41)
Albumin: 4.5 g/dL (ref 3.5–5.0)
Alkaline Phosphatase: 72 U/L (ref 38–126)
Anion gap: 14 (ref 5–15)
BUN: 8 mg/dL (ref 6–20)
CO2: 22 mmol/L (ref 22–32)
Calcium: 9.5 mg/dL (ref 8.9–10.3)
Chloride: 101 mmol/L (ref 98–111)
Creatinine, Ser: 0.74 mg/dL (ref 0.61–1.24)
GFR, Estimated: >60 mL/min (ref >60)
Glucose, Bld: 101 mg/dL — ABNORMAL HIGH (ref 70–99)
Potassium: 3.8 mmol/L (ref 3.5–5.1)
Sodium: 137 mmol/L (ref 135–145)
Total Bilirubin: 1 mg/dL (ref 0.0–1.2)
Total Protein: 7.2 g/dL (ref 6.5–8.1)

## 2024-04-20 LAB — URINALYSIS, ROUTINE W REFLEX MICROSCOPIC
Bilirubin Urine: NEGATIVE
Glucose, UA: NEGATIVE mg/dL
Hgb urine dipstick: NEGATIVE
Ketones, ur: NEGATIVE mg/dL
Nitrite: NEGATIVE
Protein, ur: NEGATIVE mg/dL
Specific Gravity, Urine: 1.003 — ABNORMAL LOW (ref 1.005–1.030)
pH: 6 (ref 5.0–8.0)

## 2024-04-20 LAB — ETHANOL: Alcohol, Ethyl (B): 48 mg/dL — ABNORMAL HIGH (ref <15)

## 2024-04-20 LAB — TSH: TSH: 1.266 u[IU]/mL (ref 0.350–4.500)

## 2024-04-20 MED ORDER — LORAZEPAM 1 MG PO TABS: 1.0000 mg | ORAL_TABLET | Freq: Two times a day (BID) | ORAL | Status: DC

## 2024-04-20 MED ORDER — LORAZEPAM 2 MG/ML IJ SOLN: 2.0000 mg | Freq: Three times a day (TID) | INTRAMUSCULAR | Status: DC | PRN

## 2024-04-20 MED ORDER — ALUM & MAG HYDROXIDE-SIMETH 200-200-20 MG/5ML PO SUSP: 30.0000 mL | ORAL | Status: DC | PRN

## 2024-04-20 MED ORDER — DIPHENHYDRAMINE HCL 50 MG PO CAPS: 50.0000 mg | ORAL_CAPSULE | Freq: Three times a day (TID) | ORAL | Status: DC | PRN

## 2024-04-20 MED ORDER — ACETAMINOPHEN 325 MG PO TABS: 650.0000 mg | ORAL_TABLET | Freq: Four times a day (QID) | ORAL | Status: DC | PRN

## 2024-04-20 MED ORDER — LOPERAMIDE HCL 2 MG PO CAPS
2.0000 mg | ORAL_CAPSULE | ORAL | Status: DC | PRN
Start: 2024-04-20 — End: 2024-04-23

## 2024-04-20 MED ORDER — DIPHENHYDRAMINE HCL 50 MG/ML IJ SOLN: 50.0000 mg | Freq: Three times a day (TID) | INTRAMUSCULAR | Status: DC | PRN

## 2024-04-20 MED ORDER — MAGNESIUM HYDROXIDE 400 MG/5ML PO SUSP: 30.0000 mL | Freq: Every day | ORAL | Status: DC | PRN

## 2024-04-20 MED ORDER — HALOPERIDOL 5 MG PO TABS: 5.0000 mg | ORAL_TABLET | Freq: Three times a day (TID) | ORAL | Status: DC | PRN

## 2024-04-20 MED ORDER — LORAZEPAM 1 MG PO TABS
1.0000 mg | ORAL_TABLET | Freq: Four times a day (QID) | ORAL | Status: DC
  Administered 2024-04-20 – 2024-04-21 (×2): 1 mg via ORAL
  Filled 2024-04-20 (×3): qty 1

## 2024-04-20 MED ORDER — ONDANSETRON 4 MG PO TBDP: 4.0000 mg | ORAL_TABLET | Freq: Four times a day (QID) | ORAL | Status: DC | PRN

## 2024-04-20 MED ORDER — HYDROXYZINE HCL 25 MG PO TABS: 25.0000 mg | ORAL_TABLET | Freq: Three times a day (TID) | ORAL | Status: DC | PRN

## 2024-04-20 MED ORDER — THIAMINE MONONITRATE 100 MG PO TABS
100.0000 mg | ORAL_TABLET | Freq: Every day | ORAL | Status: DC
  Administered 2024-04-21: 100 mg via ORAL
  Filled 2024-04-20: qty 1

## 2024-04-20 MED ORDER — LORAZEPAM 1 MG PO TABS: 1.0000 mg | ORAL_TABLET | Freq: Every day | ORAL | Status: DC

## 2024-04-20 MED ORDER — ADULT MULTIVITAMIN W/MINERALS CH
1.0000 | ORAL_TABLET | Freq: Every day | ORAL | Status: DC
  Administered 2024-04-20 – 2024-04-21 (×2): 1 via ORAL
  Filled 2024-04-20 (×2): qty 1

## 2024-04-20 MED ORDER — HALOPERIDOL LACTATE 5 MG/ML IJ SOLN: 10.0000 mg | Freq: Three times a day (TID) | INTRAMUSCULAR | Status: DC | PRN

## 2024-04-20 MED ORDER — LORAZEPAM 1 MG PO TABS: 1.0000 mg | ORAL_TABLET | Freq: Three times a day (TID) | ORAL | Status: DC

## 2024-04-20 MED ORDER — HALOPERIDOL LACTATE 5 MG/ML IJ SOLN: 5.0000 mg | Freq: Three times a day (TID) | INTRAMUSCULAR | Status: DC | PRN

## 2024-04-20 MED ORDER — THIAMINE HCL 100 MG/ML IJ SOLN: 100.0000 mg | Freq: Once | INTRAMUSCULAR | Status: DC

## 2024-04-20 MED ORDER — TRAZODONE HCL 50 MG PO TABS: 50.0000 mg | ORAL_TABLET | Freq: Every evening | ORAL | Status: DC | PRN

## 2024-04-20 NOTE — ED Notes (Addendum)
 Pt arrived to the unit, flex bed 1. Dinner given. He denies any SI, HI or AVH, stating he doesn't really know why he's here. He denies any pain or medical concerns as well. He is irritable and pacing at this moment, but does remain cooperative with staff. Because of labile mood, staff will continue to monitor. No further concerns.

## 2024-04-20 NOTE — ED Notes (Addendum)
 Patient asked this nurse if his family could bring his steroid medication and muscle relaxer for for his back, this nurse asked patient the name of the medication so that I could ask the provider for a possible order patient. This nurse advised the patient that I will reach out to provider to confirm if we can administer his home mediations,  however we may have the medication here. The patient became upset and walked away. This nurse reached out to provider with information given by patient. NP [place] advised that patients family can bring the steroid medication to facility and an order can be placed to administer.

## 2024-04-20 NOTE — ED Notes (Signed)
 Pt is currently sleeping, no distress noted, environmental check complete, will continue to monitor patient for safety.

## 2024-04-20 NOTE — BH Assessment (Addendum)
 Comprehensive Clinical Assessment (CCA) Note  04/20/2024 Louis Martinez [id number]  Disposition: Per Louis Ruts, NP, patient meets criteria for continuous observation.   Chief Complaint:  Chief Complaint  Patient presents with   IVC   Alcohol Problem   Mental Health Problem   Visit Diagnosis:  Major Depressive Disorder, Recurrent, Moderate - 296.32 Cannabis Abuse - 305.20 Alcohol Abuse - 305.00  Louis Martinez is a 38 year old male who [facility] under involuntary commitment (IVC) via the Sheriff's Department. Upon arrival, Louis Martinez appeared confused and reported not understanding why he was brought in. He stated that he believes his eldest brother was responsible for contacting law enforcement, leading to his IVC. Louis Martinez shared that his brother gave him an ultimatum to contact [facility], a residential substance use treatment facility. When he failed to call within the timeframe given, his brother resorted to initiating an IVC. Louis Martinez did eventually call the facility but reported being denied admission due to a history of fleeting suicidal thoughts. He was informed that he must first seek behavioral health treatment.  Per the IVC documentation, Louis Martinez has reportedly posed a daily danger to himself and others by making frequent threats to kill or harm himself and expressing homicidal ideation. He has also exhibited rageful outbursts during which his eyes bulge and his facial expressions and movements become uncontrollable. His most recent outburst reportedly involved destroying furniture, including a dresser and chair, and making further threats to harm himself and others. Despite this report, Louis Martinez currently denies any suicidal or homicidal ideation. He acknowledges a history of fleeting suicidal thoughts, with the most recent occurring the day prior to admission. He describes the frequency of these thoughts as inconsistent,  occurring in waves--sometimes going weeks without any thoughts, followed by periods of daily occurrences. He denies any prior suicide attempts or gestures, and also denies self-injurious behaviors. He has no access to firearms or other weapons.  Louis Martinez reports several depressive symptoms, including feelings of hopelessness, social withdrawal, and insomnia, typically sleeping only 4-5 hours per night. He denies any significant changes in appetite and reports no recent weight gain or loss. He identifies protective factors such as his family and his relationship with his children. The primary stressor he reports is ongoing family conflict. He denies any current or past hallucinations, and there is no evidence of thought disorganization during the interview. He also denies access to means and denies any current plan or intent to harm himself or others.  Regarding substance use, Louis Martinez reports beginning alcohol use at the age of 71 and engaging in daily consumption for five years, up to a pint of alcohol per day. His most recent alcohol use was on Saturday, Apr 17, 2024. He was unable to recall the amount consumed that day. He also reports regular cannabis use beginning at the age of 16, with current usage being daily, though he is unable to quantify the amount. His last reported use of cannabis was "a couple of days ago" and consisted of "a couple of hits." He denies experiencing any physical withdrawal symptoms but states he frequently suffers from anxiety. Louis Martinez is not currently engaged with a therapist or psychiatrist. He was previously prescribed Lexapro  by his primary care provider (Louis Martinez), but is unable to obtain refills due to the recent passing of his Louis Martinez. He last took Lexapro  yesterday, though he is unsure of the dosage.  Socially, Louis. Martinez is single and has two children--a  43-year-old daughter and a 73-year-old son. 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  He currently resides with his mother and stepfather. He denies any history of abuse and reports no family history of mental illness or substance use.  Louis Martinez presented as alert and oriented to person and place, though he exhibited some confusion regarding the circumstances of his hospitalization. His appearance was appropriate for his stated age, and he was cooperative with the interview. However, appears restless evidenced by pacing the room. His speech was spontaneous and normal in rate and volume. Mood was reported as "confused" and affect was congruent, though somewhat flat. Thought processes were logical and goal-directed. He denied any current suicidal or homicidal ideation, intent, or plan. There were no hallucinations or delusions noted. Insight into his condition appeared limited, and judgment was impaired given the recent behaviors and lack of understanding of his need for treatment. Cognition appeared grossly intact, though somewhat impacted by his confusion. No psychomotor agitation or retardation was noted during his assessment with this Clinician.    CCA Screening, Triage and Referral (STR)  Patient Reported Information How did you hear about us ? Legal System  What Is the Reason for Your Visit/Call Today? Louis Martinez states that he does not know why he is here, and is confused. He currently denies SI, HI, AVH, and substance/alcohol use. Louis Martinez arrived to the [facility] under IVC via [facility]. Per the IVC Louis Martinez has been posing a danger to himself daily by threatening to kill himself or harm himself daily, andf has also made homicidal threats to others. He also has very rageful outbursts and fits and during those periods his eyes bug out and his expression and movements become uncontrollable. His most recent fit included a broken dresser and chair and more threats to kill himself and others. Louis Martinez states that he does not know why he is here, and is confused. He currently denies SI, HI,  AVH, and substance/alcohol use. Louis Martinez arrived to the [facility] under IVC via [place]. Per the IVC Louis Martinez has been posing a danger to himself daily by threatening to kill himself or harm himself daily, andf has also made homicidal threats to others. He also has very rageful outbursts and fits and during those periods his eyes bug out and his expression and movements become uncontrollable. His most recent fit included a broken dresser and chair and more threats to kill himself and others.  How Long Has This Been Causing You Problems? <Week  What Do You Feel Would Help You the Most Today? Stress Management; Social Support; Alcohol or Drug Use Treatment; Treatment for Depression or other mood problem   Have You Recently Had Any Thoughts About Hurting Yourself? Yes (Patient reports suicidal ideations (fleeting thoughts). No plan or intent.)  Are You Planning to Commit Suicide/Harm Yourself At This time? No   Flowsheet Row ED from 04/20/2024 in [facility] UC from 09/04/2023 in [facility] at [facility] UC from 07/22/2022 in [facility] at [facility])  C-SSRS RISK CATEGORY No Risk No Risk No Risk       Have you Recently Had Thoughts About Hurting Someone Marigene Shoulder? No  Are You Planning to Harm Someone at This Time? No  Explanation: n/a   Have You Used Any Alcohol or Drugs in the Past 24 Hours? No  How Long Ago Did You Use Drugs or Alcohol? On-going (5  years) What Did You Use and How Much? Alcohol and THC-patient reports last use was this past Saturday and he doesn't recall the specified amount of usage.   Do You Currently Have a Therapist/Psychiatrist? No  Name of Therapist/Psychiatrist:    Have You Been Recently Discharged From Any Office Practice or Programs? No  Explanation of Discharge From Practice/Program: n/a    CCA Screening Triage Referral Assessment Type of Contact:  Face-to-Face  Telemedicine Service Delivery:   Is this Initial or Reassessment?   Date Telepsych consult ordered in [facility]:    Time Telepsych consult ordered in [facility]:    Location of Assessment: [facility] Services  Provider Location: [facility] Assessment Services   Collateral Involvement: No collateral involement at this time.   Does Patient Have a Automotive engineer Guardian? No  Legal Guardian Contact Information: No legal guardian.  Copy of Legal Guardianship Form: No - copy requested  Legal Guardian Notified of Arrival: -- (no legal guardian)  Legal Guardian Notified of Pending Discharge: -- (no legal guardian)  If Minor and Not Living with Parent(s), Who has Custody? n/a  Is CPS involved or ever been involved? Never  Is APS involved or ever been involved? Never   Patient Determined To Be At Risk for Harm To Self or Others Based on Review of Patient Reported Information or Presenting Complaint? No  Method: No Plan  Availability of Means: No access or NA  Intent: Vague intent or NA  Notification Required: No need or identified person  Additional Information for Danger to Others Potential: Previous attempts  Additional Comments for Danger to Others Potential: Patient denies HI. Denies that he feels as if he is a danger to others.  Are There Guns or Other Weapons in Your Home? No  Types of Guns/Weapons: Patient denies.  Are These Weapons Safely Secured?                            No  Who Could Verify You Are Able To Have These Secured: Patient denies access to weapons or guns.  Do You Have any Outstanding Charges, Pending Court Dates, Parole/Probation? Patient denies.  Contacted To Inform of Risk of Harm To Self or Others: Other: Comment (No contact made at this time. Patient denies HI.)    Does Patient Present under Involuntary Commitment? No    [place] of Residence: [place]   Patient Currently Receiving the Following Services: -- (Patient does  not have any services available at this time.)   Determination of Need: Urgent (48 hours)   Options For Referral: Medication Management; Facility-Based Crisis; Outpatient Therapy; Chemical Dependency Intensive Outpatient Therapy (CDIOP)     CCA Biopsychosocial Patient Reported Schizophrenia/Schizoaffective Diagnosis in Past: No   Strengths: Patient is cooperative with answering questions.   Mental Health Symptoms Depression:  Change in energy/activity; Difficulty Concentrating; Fatigue; Hopelessness; Irritability; Increase/decrease in appetite   Duration of Depressive symptoms: Duration of Depressive Symptoms: Greater than two weeks   Mania:  Racing thoughts; Increased Energy; Irritability; Change in energy/activity   Anxiety:   Tension; Restlessness; Irritability   Psychosis:  None   Duration of Psychotic symptoms:    Trauma:  Emotional numbing; Difficulty staying/falling asleep; Irritability/anger   Obsessions:  None   Compulsions:  Poor Insight; Intended to reduce stress or prevent another outcome   Inattention:  Poor follow-through on tasks; Disorganized   Hyperactivity/Impulsivity:  None   Oppositional/Defiant Behaviors:  Easily annoyed   Emotional  Irregularity:  None   Other Mood/Personality Symptoms:  Patient is calm and cooperative.    Mental Status Exam Appearance and self-care  Stature:  Average   Weight:  Average weight   Clothing:  Neat/clean   Grooming:  Normal   Cosmetic use:  None   Posture/gait:  Tense   Motor activity:  Restless   Sensorium  Attention:  Inattentive   Concentration:  Anxiety interferes; Scattered   Orientation:  Time; Situation; Place; Person; Object   Recall/memory:  Normal   Affect and Mood  Affect:  Anxious   Mood:  Anxious   Relating  Eye contact:  Fleeting   Facial expression:  Anxious; Tense   Attitude toward examiner:  Cooperative   Thought and Language  Speech flow: Clear and Coherent    Thought content:  Appropriate to Mood and Circumstances   Preoccupation:  None   Hallucinations:  None   Organization: Appropriate   [employer] of Knowledge:  Varies   Intelligence:  Average   Abstraction:  Normal   Judgement:  Poor  Reality Testing:  Adequate   Insight:  Poor   Decision Making:  Impulsive   Social Functioning  Social Maturity:  Irresponsible   Social Judgement:  Heedless   Stress  Stressors:  Family conflict   Coping Ability:  Overwhelmed   Skill Deficits:  Decision making; Interpersonal   Supports:  Family     Religion: Religion/Spirituality Are You A Religious Person?: No How Might This Affect Treatment?: n/a  Leisure/Recreation: Leisure / Recreation Do You Have Hobbies?: No  Exercise/Diet: Exercise/Diet Do You Exercise?: No Have You Gained or Lost A Significant Amount of Weight in the Past Six Months?: No Do You Follow a Special Diet?: No Do You Have Any Trouble Sleeping?: No   CCA Employment/Education Employment/Work Situation: Employment / Work Situation Employment Situation: Employed Work Stressors: "Cabin crew at [employer] Job has Been Impacted by Current Illness: No Has Patient ever Been in the [employer]?: No  Education: Education Is Patient Currently Attending School?: No Last Grade Completed:  ("Some college") Did Theme park manager?: No Did You Have An Individualized Education Program (IIEP): No Did You Have Any Difficulty At [employer]?: No Patient's Education Has Been Impacted by Current Illness: No   CCA Family/Childhood History Family and Relationship History: Family history Marital status: Single Does patient have children?: Yes How many children?:  (2) How is patient's relationship with their children?: Patient states that he has a decent relationship with his 2 children.  Childhood History:  Childhood History By whom was/is the patient raised?: Mother, Other (Comment) (Mother and  Step father) Did patient suffer any verbal/emotional/physical/sexual abuse as a child?: No Did patient suffer from severe childhood neglect?: No Has patient ever been sexually abused/assaulted/raped as an adolescent or adult?: No Was the patient ever a victim of a crime or a disaster?: No Witnessed domestic violence?: No Has patient been affected by domestic violence as an adult?: No       CCA Substance Use Alcohol/Drug Use: Alcohol / Drug Use Pain Medications: SEE MAR Prescriptions: SEE MAR Over the Counter: SEE MAR History of alcohol / drug use?: Yes Longest period of sobriety (when/how long): n/a Negative Consequences of Use: Personal relationships Withdrawal Symptoms: Other (Comment) (Anxiety) Substance #1 Name of Substance 1: Alcohol 1 - Age of First Use: 14 1 - Amount (size/oz): pint to #1 liter 1 - Frequency: daily 1 - Duration: on-going 1 - Last Use / Amount: 04/17/2024  1 - Method of Aquiring: n/a 1- Route of Use: oral Substance #2 Name of Substance 2: THC 2 - Age of First Use: 38 years old 2 - Amount (size/oz): "couple of hits" 2 - Frequency: daily 2 - Duration: on-going 2 - Last Use / Amount: "A few days ago" 2 - Method of Aquiring: on-going 2 - Route of Substance Use: smoke                     ASAM's:  Six Dimensions of Multidimensional Assessment  Dimension 1:  Acute Intoxication and/or Withdrawal Potential:      Dimension 2:  Biomedical Conditions and Complications:      Dimension 3:  Emotional, Behavioral, or Cognitive Conditions and Complications:     Dimension 4:  Readiness to Change:     Dimension 5:  Relapse, Continued use, or Continued Problem Potential:     Dimension 6:  Recovery/Living Environment:     ASAM Severity Score:    ASAM Recommended Level of Treatment:     Substance use Disorder (SUD) Substance Use Disorder (SUD)  Checklist Symptoms of Substance Use: Continued use despite having a persistent/recurrent  physical/psychological problem caused/exacerbated by use, Continued use despite persistent or recurrent social, interpersonal problems, caused or exacerbated by use, Evidence of withdrawal (Comment), Persistent desire or unsuccessful efforts to cut down or control use, Presence of craving or strong urge to use, Repeated use in physically hazardous situations, Recurrent use that results in a failure to fulfill major role obligations (work, school, home), Social, occupational, recreational activities given up or reduced due to use, Substance(s) often taken in larger amounts or over longer times than was intended, Large amounts of time spent to obtain, use or recover from the substance(s)  Recommendations for Services/Supports/Treatments: Recommendations for Services/Supports/Treatments Recommendations For Services/Supports/Treatments: Medication Management, Individual Therapy, CD-IOP Intensive Chemical Dependency Program, Facility Based Crisis, Residential-Level 1  Disposition Recommendation per psychiatric provider: We recommend inpatient psychiatric hospitalization after medical hospitalization. Patient has been involuntarily committed on 04/20/2024.    DSM5 Diagnoses: Patient Active Problem List   Diagnosis Date Noted   Abnormal glucose 09/27/2019   Elevated BP without diagnosis of hypertension 09/27/2019   Hip flexor tightness 05/26/2018   Hyperlipidemia, mixed    Vitamin D  deficiency    Alcohol abuse 09/14/2012     Referrals to Alternative Service(s): Referred to Alternative Service(s):   Place:   Date:   Time:    Referred to Alternative Service(s):   Place:   Date:   Time:    Referred to Alternative Service(s):   Place:   Date:   Time:    Referred to Alternative Service(s):   Place:   Date:   Time:     Romilda Coaster, Counselor

## 2024-04-20 NOTE — Progress Notes (Addendum)
   04/20/24 1634  [facility]UC Triage Screening (Walk-ins at [facility] only)  What Is the Reason for Your Visit/Call Today? Mr. Hommerding states that he does not know why he is here, and is confused. He currently denies SI, HI, AVH, and substance/alcohol use. Mr Kochanowski arrived to the [facility] under IVC via [place]. Per the IVC Mr. Nothdurft has been posing a danger to himself daily by threatening to kill himself or harm himself daily, andf has also made homicidal threats to others. He also has very rageful outbursts and fits and during those periods his eyes bug out and his expression and movements become uncontrollable. His most recent fit included a broken dresser and chair and more threats to kill himself and others.  How Long Has This Been Causing You Problems? <Week  Have You Recently Had Any Thoughts About Hurting Yourself? No  Are You Planning to Commit Suicide/Harm Yourself At This time? No  Have you Recently Had Thoughts About Hurting Someone Marigene Shoulder? No  Are You Planning To Harm Someone At This Time? No  Physical Abuse Denies  Verbal Abuse Denies  Sexual Abuse Denies  Exploitation of patient/patient's resources Denies  Self-Neglect Denies  Are you currently experiencing any auditory, visual or other hallucinations? No  Have You Used Any Alcohol or Drugs in the Past 24 Hours? No  Do you have any current medical co-morbidities that require immediate attention? No  Clinician description of patient physical appearance/behavior: Agitated, non-compliant, confused, and anxious  What Do You Feel Would Help You the Most Today? Social Support;Stress Management;Medication(s)  Determination of Need Routine (7 days)  Options For Referral [facility];Other: Comment;Facility-Based Crisis;Outpatient Therapy  Determination of Need filed?  --

## 2024-04-21 ENCOUNTER — Other Ambulatory Visit (HOSPITAL_COMMUNITY)
Admission: EM | Admit: 2024-04-21 | Discharge: 2024-04-26 | Disposition: A | Discharge status: Home / Self Care | Attending: Psychiatry | Admitting: Psychiatry

## 2024-04-21 DIAGNOSIS — Z79899 Other long term (current) drug therapy: Secondary | ICD-10-CM | POA: Diagnosis not present

## 2024-04-21 DIAGNOSIS — F1910 Other psychoactive substance abuse, uncomplicated: Secondary | ICD-10-CM

## 2024-04-21 DIAGNOSIS — F1994 Other psychoactive substance use, unspecified with psychoactive substance-induced mood disorder: Secondary | ICD-10-CM | POA: Diagnosis not present

## 2024-04-21 DIAGNOSIS — F10239 Alcohol dependence with withdrawal, unspecified: Secondary | ICD-10-CM | POA: Diagnosis not present

## 2024-04-21 DIAGNOSIS — F418 Other specified anxiety disorders: Secondary | ICD-10-CM | POA: Insufficient documentation

## 2024-04-21 DIAGNOSIS — F1024 Alcohol dependence with alcohol-induced mood disorder: Secondary | ICD-10-CM | POA: Diagnosis not present

## 2024-04-21 DIAGNOSIS — R456 Violent behavior: Secondary | ICD-10-CM | POA: Diagnosis not present

## 2024-04-21 DIAGNOSIS — F1020 Alcohol dependence, uncomplicated: Secondary | ICD-10-CM | POA: Diagnosis present

## 2024-04-21 DIAGNOSIS — R45851 Suicidal ideations: Secondary | ICD-10-CM | POA: Diagnosis not present

## 2024-04-21 DIAGNOSIS — F1210 Cannabis abuse, uncomplicated: Secondary | ICD-10-CM | POA: Insufficient documentation

## 2024-04-21 DIAGNOSIS — F1110 Opioid abuse, uncomplicated: Secondary | ICD-10-CM | POA: Diagnosis present

## 2024-04-21 LAB — VITAMIN B12: Vitamin B-12: 517 pg/mL (ref 180–914)

## 2024-04-21 LAB — CBC
HCT: 41.5 % (ref 39.0–52.0)
Hemoglobin: 14.8 g/dL (ref 13.0–17.0)
MCH: 33.7 pg (ref 26.0–34.0)
MCHC: 35.7 g/dL (ref 30.0–36.0)
MCV: 94.5 fL (ref 80.0–100.0)
Platelets: 226 10*3/uL (ref 150–400)
RBC: 4.39 MIL/uL (ref 4.22–5.81)
RDW: 12.2 % (ref 11.5–15.5)
WBC: 9.6 10*3/uL (ref 4.0–10.5)
nRBC: 0 % (ref 0.0–0.2)

## 2024-04-21 LAB — VITAMIN D 25 HYDROXY (VIT D DEFICIENCY, FRACTURES): Vit D, 25-Hydroxy: 39.05 ng/mL (ref 30–100)

## 2024-04-21 MED ORDER — LOPERAMIDE HCL 2 MG PO CAPS: 2.0000 mg | ORAL_CAPSULE | ORAL | Status: AC | PRN

## 2024-04-21 MED ORDER — DIPHENHYDRAMINE HCL 50 MG/ML IJ SOLN: 50.0000 mg | Freq: Three times a day (TID) | INTRAMUSCULAR | Status: DC | PRN

## 2024-04-21 MED ORDER — ACETAMINOPHEN 325 MG PO TABS: 650.0000 mg | ORAL_TABLET | Freq: Four times a day (QID) | ORAL | Status: DC | PRN

## 2024-04-21 MED ORDER — HALOPERIDOL LACTATE 5 MG/ML IJ SOLN: 10.0000 mg | Freq: Three times a day (TID) | INTRAMUSCULAR | Status: DC | PRN

## 2024-04-21 MED ORDER — TRAZODONE HCL 100 MG PO TABS
100.0000 mg | ORAL_TABLET | Freq: Every day | ORAL | Status: DC
  Administered 2024-04-21 – 2024-04-25 (×5): 100 mg via ORAL
  Filled 2024-04-21 (×5): qty 1

## 2024-04-21 MED ORDER — ESCITALOPRAM OXALATE 10 MG PO TABS
10.0000 mg | ORAL_TABLET | Freq: Every day | ORAL | Status: DC
Start: 2024-04-21 — End: 2024-04-21
  Administered 2024-04-21: 10 mg via ORAL
  Filled 2024-04-21 (×2): qty 1

## 2024-04-21 MED ORDER — THIAMINE HCL 100 MG/ML IJ SOLN: 100.0000 mg | Freq: Once | INTRAMUSCULAR | Status: DC

## 2024-04-21 MED ORDER — LORAZEPAM 1 MG PO TABS
1.0000 mg | ORAL_TABLET | Freq: Three times a day (TID) | ORAL | Status: AC
  Administered 2024-04-21 – 2024-04-22 (×3): 1 mg via ORAL
  Filled 2024-04-21 (×3): qty 1

## 2024-04-21 MED ORDER — ALUM & MAG HYDROXIDE-SIMETH 200-200-20 MG/5ML PO SUSP: 30.0000 mL | ORAL | Status: DC | PRN

## 2024-04-21 MED ORDER — ADULT MULTIVITAMIN W/MINERALS CH
1.0000 | ORAL_TABLET | Freq: Every day | ORAL | Status: DC
  Administered 2024-04-22 – 2024-04-26 (×5): 1 via ORAL
  Filled 2024-04-21 (×5): qty 1

## 2024-04-21 MED ORDER — HALOPERIDOL LACTATE 5 MG/ML IJ SOLN: 5.0000 mg | Freq: Three times a day (TID) | INTRAMUSCULAR | Status: DC | PRN

## 2024-04-21 MED ORDER — HALOPERIDOL 5 MG PO TABS: 5.0000 mg | ORAL_TABLET | Freq: Three times a day (TID) | ORAL | Status: DC | PRN

## 2024-04-21 MED ORDER — LORAZEPAM 2 MG/ML IJ SOLN: 2.0000 mg | Freq: Three times a day (TID) | INTRAMUSCULAR | Status: DC | PRN

## 2024-04-21 MED ORDER — DIPHENHYDRAMINE HCL 50 MG PO CAPS: 50.0000 mg | ORAL_CAPSULE | Freq: Three times a day (TID) | ORAL | Status: DC | PRN

## 2024-04-21 MED ORDER — ESCITALOPRAM OXALATE 10 MG PO TABS
10.0000 mg | ORAL_TABLET | Freq: Every day | ORAL | Status: DC
  Administered 2024-04-22 – 2024-04-26 (×5): 10 mg via ORAL
  Filled 2024-04-21 (×5): qty 1

## 2024-04-21 MED ORDER — THIAMINE MONONITRATE 100 MG PO TABS
100.0000 mg | ORAL_TABLET | Freq: Every day | ORAL | Status: DC
  Administered 2024-04-22 – 2024-04-26 (×5): 100 mg via ORAL
  Filled 2024-04-21 (×5): qty 1

## 2024-04-21 MED ORDER — LORAZEPAM 1 MG PO TABS
1.0000 mg | ORAL_TABLET | Freq: Two times a day (BID) | ORAL | Status: AC
  Administered 2024-04-22 – 2024-04-23 (×2): 1 mg via ORAL
  Filled 2024-04-21 (×2): qty 1

## 2024-04-21 MED ORDER — TRAZODONE HCL 50 MG PO TABS: 50.0000 mg | ORAL_TABLET | Freq: Every evening | ORAL | Status: DC | PRN

## 2024-04-21 MED ORDER — ONDANSETRON 4 MG PO TBDP: 4.0000 mg | ORAL_TABLET | Freq: Four times a day (QID) | ORAL | Status: AC | PRN

## 2024-04-21 MED ORDER — LORAZEPAM 1 MG PO TABS
1.0000 mg | ORAL_TABLET | Freq: Every day | ORAL | Status: AC
  Administered 2024-04-24: 1 mg via ORAL
  Filled 2024-04-21: qty 1

## 2024-04-21 MED ORDER — LORAZEPAM 1 MG PO TABS
1.0000 mg | ORAL_TABLET | Freq: Four times a day (QID) | ORAL | Status: AC
  Administered 2024-04-21: 1 mg via ORAL
  Filled 2024-04-21: qty 1

## 2024-04-21 MED ORDER — HYDROXYZINE HCL 25 MG PO TABS
25.0000 mg | ORAL_TABLET | Freq: Three times a day (TID) | ORAL | Status: DC | PRN
  Administered 2024-04-22 – 2024-04-23 (×2): 25 mg via ORAL
  Filled 2024-04-21 (×2): qty 1

## 2024-04-21 MED ORDER — MAGNESIUM HYDROXIDE 400 MG/5ML PO SUSP: 30.0000 mL | Freq: Every day | ORAL | Status: DC | PRN

## 2024-04-21 NOTE — ED Notes (Signed)
Patient is sleeping. Respirations equal and unlabored, skin warm and dry, NAD. No change in assessment or acuity. Routine safety checks conducted according to facility protocol. Will continue to monitor for safety.   

## 2024-04-21 NOTE — ED Notes (Signed)
 Patient is sitting in his bed, respirations are even and unlabored, patient appears to be in no acute distress, will continue to monitor patient for safety.

## 2024-04-21 NOTE — ED Notes (Signed)
 Patient was asked if he would like some lunch and he declined.

## 2024-04-21 NOTE — ED Notes (Signed)
 Bllod sample obtained form left antecubital space for D25OH, CBC and Vitamin B 12 resulting without incident. Patient tolerated draw well.

## 2024-04-21 NOTE — ED Notes (Signed)
 A book was brought to the facility by the family for pt. The book has been delivered to pt.

## 2024-04-21 NOTE — ED Notes (Signed)
 Patient A&O x 4, calm but appears irritable with a blunted congruent affect. Patient forward little with communication, appears guarded and defensive with interaction.Patient denies SI, HI, AVH and does not appear to be responding to internal stimuli. Patient is blaming of family for placement and denies all alligations set forth in the IVC. Patient appears focused on wanting to discharge from the facility,but is aware of his IVC status. Patient laid down post assessment.

## 2024-04-21 NOTE — Group Note (Signed)
 Group Topic: Positive Affirmations  Group Date: 04/21/2024 Start Time: 1655 End Time: 1715 Facilitators: Ardie Kras L, RN  Department: [facility]  Number of Participants: 9  Group Focus: affirmation, check in, and nursing group Treatment Modality:  Psychoeducation Interventions utilized were exploration, group exercise, mental fitness, reality testing, and support Purpose: enhance coping skills, explore maladaptive thinking, increase insight, regain self-worth, and reinforce self-care  Name: Louis Martinez Date of Birth: [birth year]  MR: [id number]    Level of Participation: active Quality of Participation: cooperative, engaged, and offered feedback Interactions with others: gave feedback Mood/Affect: appropriate and positive Triggers (if applicable): n/a Cognition: coherent/clear, insightful, and logical Progress: Gaining insight Response: Patient expressed his desire to make a positive change in his life. Patient also gave feedback as to how positively changing his way of thinking and responding to situations could be of assistance. Plan: follow-up needed  Patients Problems:  Patient Active Problem List   Diagnosis Date Noted   Substance induced mood disorder (HCC) 04/21/2024   Abnormal glucose 09/27/2019   Elevated BP without diagnosis of hypertension 09/27/2019   Hip flexor tightness 05/26/2018   Hyperlipidemia, mixed    Vitamin D  deficiency    Alcohol abuse 09/14/2012

## 2024-04-21 NOTE — ED Provider Notes (Addendum)
 FBC/OBS ASAP Discharge Summary  Date and Time: 04/21/2024 11:49 AM  Name: Louis Martinez  MRN:  [id number]   Discharge Diagnoses:  Final diagnoses:  Substance induced mood disorder (HCC)  Alcohol use disorder  GAD (generalized anxiety disorder)   History of Present illness:Per initial assessment: "Louis Martinez is a 38 year old male who presented to the Behavioral Health Urgent Care [facility]) under involuntary commitment (IVC) via the Sheriff's Department. Per the IVC documentation, Louis Martinez has reportedly posed a daily danger to himself and others by making frequent threats to kill or harm himself and expressing homicidal ideation. He has also exhibited rageful outbursts during which his eyes bulge and his facial expressions and movements become uncontrollable. His most recent outburst reportedly involved destroying furniture, including a dresser and chair, and making further threats to harm himself and others."    Stay Summary: Patient was admitted to the observation area of the [facility], and reassessed earlier today morning. On assessment today, pt is much calmer, but still somewhat guarded, states, "I don't know what is going on. I'm just waiting for the next step", when asked how he is doing today. Pt denies SI, denies HI, denies AVH, denies delusional thoughts. There are no overt signs of psychosis. Denies first rank symptoms. Mood appears depressed even though he states "I am fine". He is focused on wanting to be discharged and repeatedly asks during encounter "When do I get Louis Martinez here?" Pt seems not to be wanting to share information which might explain why he is guarded; wanting secondary gains of discharge from the facility. He is minimizing his symptoms.  Overall, pt is tolerating the Ativan detox protocol well. We restarted him on his Lexapro  which he states that he was taking prior to this hospitalization. He is not sure of the dosage that he was taking, but as per chart review, he was  taking 20 mg at home as well as Buspirone  10 mg TID, & Trazodone  150 mg nightly. Due to the fact that pt may not have been medication compliant home, we started him on Lexapro  10 mg daily for depressive symptoms, and Trazodone  100 mg nightly for sleep.  Patient has been accepted to the Facility Based Crises here at the [facility], and we will transfer him there for continuity of care.  Total Time spent with patient: 45 minutes  Past Psychiatric History: MDD, GAD, alcohol use d/o Past Medical History: denies  Family History: not provided Family Psychiatric History: not provided Tobacco Cessation:  N/A, patient does not currently use tobacco products  Current Medications:  Current Facility-Administered Medications  Medication Dose Route Frequency Provider Last Rate Last Admin   acetaminophen  (TYLENOL ) tablet 650 mg  650 mg Oral Q6H PRN Yamilka Lopiccolo, NP       alum & mag hydroxide-simeth (MAALOX/MYLANTA) 200-200-20 MG/5ML suspension 30 mL  30 mL Oral Q4H PRN Myanna Ziesmer, NP       haloperidol (HALDOL) tablet 5 mg  5 mg Oral TID PRN Robet Chiquito, NP       And   diphenhydrAMINE (BENADRYL) capsule 50 mg  50 mg Oral TID PRN Robet Chiquito, NP       haloperidol lactate (HALDOL) injection 5 mg  5 mg Intramuscular TID PRN Robet Chiquito, NP       And   diphenhydrAMINE (BENADRYL) injection 50 mg  50 mg Intramuscular TID PRN Robet Chiquito, NP       And   LORazepam (ATIVAN) injection 2 mg  2 mg Intramuscular TID PRN Jaeshaun Riva,  Marybel Alcott, NP       haloperidol lactate (HALDOL) injection 10 mg  10 mg Intramuscular TID PRN Robet Chiquito, NP       And   diphenhydrAMINE (BENADRYL) injection 50 mg  50 mg Intramuscular TID PRN Robet Chiquito, NP       And   LORazepam (ATIVAN) injection 2 mg  2 mg Intramuscular TID PRN Robet Chiquito, NP       escitalopram  (LEXAPRO ) tablet 10 mg  10 mg Oral Daily Jakya Dovidio, NP   10 mg at 04/21/24 1014   hydrOXYzine (ATARAX) tablet 25 mg  25 mg Oral TID PRN Robet Chiquito,  NP       loperamide (IMODIUM) capsule 2-4 mg  2-4 mg Oral PRN Robet Chiquito, NP       LORazepam (ATIVAN) tablet 1 mg  1 mg Oral QID Daneen Volcy, NP   1 mg at 04/21/24 1014   Followed by   LORazepam (ATIVAN) tablet 1 mg  1 mg Oral TID Lene Mckay, NP       Followed by   Cecily Cohen ON 04/22/2024] LORazepam (ATIVAN) tablet 1 mg  1 mg Oral BID Apolonia Ellwood, NP       Followed by   Cecily Cohen ON 04/24/2024] LORazepam (ATIVAN) tablet 1 mg  1 mg Oral Daily Vane Yapp, NP       magnesium hydroxide (MILK OF MAGNESIA) suspension 30 mL  30 mL Oral Daily PRN Robet Chiquito, NP       multivitamin with minerals tablet 1 tablet  1 tablet Oral Daily Efe Fazzino, NP   1 tablet at 04/21/24 1014   ondansetron (ZOFRAN-ODT) disintegrating tablet 4 mg  4 mg Oral Q6H PRN Robet Chiquito, NP       thiamine (VITAMIN B1) injection 100 mg  100 mg Intramuscular Once Bode Pieper, NP       thiamine (VITAMIN B1) tablet 100 mg  100 mg Oral Daily Ieasha Boerema, NP   100 mg at 04/21/24 1014   traZODone  (DESYREL ) tablet 50 mg  50 mg Oral QHS PRN Robet Chiquito, NP       Current Outpatient Medications  Medication Sig Dispense Refill   escitalopram  (LEXAPRO ) 20 MG tablet Take 1 tablet by mouth Daily for Mood (Patient taking differently: Take 20 mg by mouth daily. Take 1 tablet by mouth Daily for Mood) 90 tablet 1   methocarbamol (ROBAXIN) 500 MG tablet Take 500 mg by mouth 3 (three) times daily as needed for muscle spasms.     methylPREDNISolone  (MEDROL  DOSEPAK) 4 MG TBPK tablet Take 4-8 mg by mouth See admin instructions. Take as directed on package for 6 days starting on 04/18/24 (Patient not taking: Reported on 04/21/2024)      PTA Medications:  Facility Ordered Medications  Medication   acetaminophen  (TYLENOL ) tablet 650 mg   alum & mag hydroxide-simeth (MAALOX/MYLANTA) 200-200-20 MG/5ML suspension 30 mL   magnesium hydroxide (MILK OF MAGNESIA) suspension 30 mL   haloperidol (HALDOL) tablet 5 mg   And    diphenhydrAMINE (BENADRYL) capsule 50 mg   haloperidol lactate (HALDOL) injection 5 mg   And   diphenhydrAMINE (BENADRYL) injection 50 mg   And   LORazepam (ATIVAN) injection 2 mg   haloperidol lactate (HALDOL) injection 10 mg   And   diphenhydrAMINE (BENADRYL) injection 50 mg   And   LORazepam (ATIVAN) injection 2 mg   hydrOXYzine (ATARAX) tablet 25 mg   traZODone  (DESYREL ) tablet 50 mg   thiamine (VITAMIN B1) injection  100 mg   thiamine (VITAMIN B1) tablet 100 mg   multivitamin with minerals tablet 1 tablet   loperamide (IMODIUM) capsule 2-4 mg   ondansetron (ZOFRAN-ODT) disintegrating tablet 4 mg   LORazepam (ATIVAN) tablet 1 mg   Followed by   LORazepam (ATIVAN) tablet 1 mg   Followed by   Cecily Cohen ON 04/22/2024] LORazepam (ATIVAN) tablet 1 mg   Followed by   Cecily Cohen ON 04/24/2024] LORazepam (ATIVAN) tablet 1 mg   escitalopram  (LEXAPRO ) tablet 10 mg   PTA Medications  Medication Sig   escitalopram  (LEXAPRO ) 20 MG tablet Take 1 tablet by mouth Daily for Mood (Patient taking differently: Take 20 mg by mouth daily. Take 1 tablet by mouth Daily for Mood)   methocarbamol (ROBAXIN) 500 MG tablet Take 500 mg by mouth 3 (three) times daily as needed for muscle spasms.   methylPREDNISolone  (MEDROL  DOSEPAK) 4 MG TBPK tablet Take 4-8 mg by mouth See admin instructions. Take as directed on package for 6 days starting on 04/18/24 (Patient not taking: Reported on 04/21/2024)       07/07/2018   12:21 AM  Depression screen PHQ 2/9  Decreased Interest 0  Down, Depressed, Hopeless 0  PHQ - 2 Score 0    Flowsheet Row ED from 04/20/2024 in [facility] from 09/04/2023 in [facility] at [facility]) UC from 07/22/2022 in [facility] at [facility])  C-SSRS RISK CATEGORY No Risk No Risk No Risk       Musculoskeletal  Strength & Muscle Tone: within normal limits Gait & Station: normal Patient leans:  N/A  Psychiatric Specialty Exam  Presentation  General Appearance:  Fairly Groomed  Eye Contact: Fair  Speech: Clear and Coherent  Speech Volume: Normal  Handedness: Right   Mood and Affect  Mood: Depressed; Anxious  Affect: Congruent   Thought Process  Thought Processes: Coherent  Descriptions of Associations:Intact  Orientation:Full (Time, Place and Person)  Thought Content:Logical  Diagnosis of Schizophrenia or Schizoaffective disorder in past: No    Hallucinations:Hallucinations: None  Ideas of Reference:None  Suicidal Thoughts:Suicidal Thoughts: No SI Passive Intent and/or Plan: Without Intent; Without Plan  Homicidal Thoughts:Homicidal Thoughts: No HI Passive Intent and/or Plan: Without Intent; Without Plan   Sensorium  Memory: Immediate Fair  Judgment: Fair  Insight: Fair   Chartered certified accountant: Fair  Attention Span: Fair  Recall: [employer] of Knowledge: Fair  Language: Fair   Psychomotor Activity  Psychomotor Activity: Psychomotor Activity: Normal   Assets  Assets: Resilience; Social Support   Sleep  Sleep: Sleep: Fair   Nutritional Assessment (For OBS and FBC admissions only) Has the patient had a weight loss or gain of 10 pounds or more in the last 3 months?: No Has the patient had a decrease in food intake/or appetite?: No Does the patient have dental problems?: No Does the patient have eating habits or behaviors that may be indicators of an eating disorder including binging or inducing vomiting?: No Has the patient recently lost weight without trying?: 0 Has the patient been eating poorly because of a decreased appetite?: 0 Malnutrition Screening Tool Score: 0    Physical Exam  Physical Exam Neurological:     General: No focal deficit present.     Mental Status: He is oriented to person, place, and time.  Psychiatric:        Thought Content: Thought content normal.    Review of  Systems  Psychiatric/Behavioral:  Positive for depression and substance abuse. Negative for hallucinations, memory loss and suicidal ideas. The patient is nervous/anxious and has insomnia.   All other systems reviewed and are negative.  Blood pressure (!) 130/94, pulse 99, temperature 98.1 F (36.7 C), temperature source Oral, resp. rate 16, SpO2 97%. There is no height or weight on file to calculate BMI.  Demographic Factors:  Male and Caucasian  Loss Factors: Financial problems/change in socioeconomic status  Historical Factors: Impulsivity  Risk Reduction Factors:   Living with another person, especially a relative  Continued Clinical Symptoms:  Alcohol/Substance Abuse/Dependencies Previous Psychiatric Diagnoses and Treatments  Cognitive Features That Contribute To Risk:  None    Suicide Risk:  Moderate:  Frequent suicidal ideation with limited intensity, and duration, some specificity in terms of plans, no associated intent, good self-control, limited dysphoria/symptomatology, some risk factors present, and identifiable protective factors, including available and accessible social support.  Plan Of Care/Follow-up recommendations:  -Continue medications as per the [facility] including Ativan taper for ETOH abuse. -Repeat UA, and CBC due to elevated WBCs -Complete Vit D and B12  Disposition:  Transfer to the [facility] here at the [facility] for continuity of care  Robet Chiquito, NP 04/21/2024, 11:49 AM

## 2024-04-21 NOTE — ED Provider Notes (Addendum)
 Behavioral Health Urgent Care Medical Screening Exam  Patient Name: Drexel Velardo MRN: [id number] Date of Evaluation: 04/21/24 Chief Complaint:  SI/HI Diagnosis:  Final diagnoses:  Substance induced mood disorder (HCC)  Alcohol use disorder  GAD (generalized anxiety disorder)   History of Present illness:Per initial assessment: "Ryelee Peabody is a 38 year old male who presented to the Behavioral Health Urgent Care [facility]) under involuntary commitment (IVC) via the Sheriff's Department. Per the IVC documentation, Mr. Marcelin has reportedly posed a daily danger to himself and others by making frequent threats to kill or harm himself and expressing homicidal ideation. He has also exhibited rageful outbursts during which his eyes bulge and his facial expressions and movements become uncontrollable. His most recent outburst reportedly involved destroying furniture, including a dresser and chair, and making further threats to harm himself and others."   Assessment: During encounter with pt, he presents with an irritable mood, he is agitated, angry, and provides short responses to questions being asked. He however shares that he drinks a pint to a Liter of liquor daily, denies a history of alcohol related seizures, denies any history of Dts, denies ever feeling like he requires alcohol first thing in the morning. Denies any other substance use. Pt does admit to making suicidal remarks at home to his family, denies that he had an intent or plan to harm himself. Denies HI, denies AVH, denies paranoia or a history of psychosis. There are no overt signs of Psychosis.  Pt shares that he has been diagnosed with MDD in the past, currently takes Lexapro , is unsure of the strength, denies any other  medication trials. Reports that he has been consistent with his mental health medications.   Pt provides limited information to Clinical research associate. Please see note from [facility] Counselor for more information regarding mental health  history. Pt denies having any medical conditions. Face appears flushed & red, pt is restless, seems very anxious, concentration is poor. Pt is minimizing his symptoms, & writer has completed first examination, and IVC is being upheld as presenting symptoms render pt at a high risk of danger to himself & others.   We will recommend inpatient hospitalization for alcohol detox & for treatment and stabilization of mental status.  Flowsheet Row ED from 04/20/2024 in [facility] UC from 09/04/2023 in [facility] Care at [facility]) UC from 07/22/2022 in [facility] Urgent Care at [facility])  C-SSRS RISK CATEGORY No Risk No Risk No Risk       Psychiatric Specialty Exam  Presentation  General Appearance:Fairly Groomed  Eye Contact:Fair  Speech:Clear and Coherent  Speech Volume:Normal  Handedness:Right   Mood and Affect  Mood:Depressed; Anxious  Affect:Congruent   Thought Process  Thought Processes:Coherent  Descriptions of Associations:Intact  Orientation:Full (Time, Place and Person)  Thought Content:Logical  Diagnosis of Schizophrenia or Schizoaffective disorder in past: No   Hallucinations:None  Ideas of Reference:None  Suicidal Thoughts:Yes, Passive Without Intent; Without Plan  Homicidal Thoughts:Yes, Passive Without Intent; Without Plan   Sensorium  Memory:Immediate Fair  Judgment:Fair  Insight:Fair   Executive Functions  Concentration:Fair  Attention Span:Fair  Recall:Fair  Fund of Knowledge:Fair  Language:Fair   Psychomotor Activity  Psychomotor Activity:Normal   Assets  Assets:Resilience   Sleep  Sleep:Fair  Number of hours: No data recorded  Physical Exam: Physical Exam Constitutional:      Appearance: Normal appearance.  Eyes:     Pupils: Pupils are equal, round, and reactive to light.  Neurological:  General: No focal deficit present.     Mental  Status: He is alert and oriented to person, place, and time.    Review of Systems  Psychiatric/Behavioral:  Positive for depression, substance abuse and suicidal ideas. Negative for hallucinations and memory loss. The patient is nervous/anxious and has insomnia.   All other systems reviewed and are negative.  Blood pressure (!) 130/94, pulse 99, temperature 98.1 F (36.7 C), temperature source Oral, resp. rate 16, SpO2 97%. There is no height or weight on file to calculate BMI.  Musculoskeletal: Strength & Muscle Tone: within normal limits Gait & Station: normal Patient leans: N/A  [facility] MSE Discharge Disposition for Follow up and Recommendations: Based on my evaluation the patient appears to have an emergency mental health condition for which I recommend the patient be transferred to an inpatient behavioral health unit for treatment and stabilization of his mental status, and for alcohol detox. Pt verbalizes not being interested in rehabilitation for substance use at this time.  -Recommended for inpatient treatment & stabilization -Ordered baseline labs -Started Lexapro  10 mg daily for MDD/GAD -Started Hydroxyzine 25 mg TID PRN for break through anxiety -Started Trazodone  50 mg nightly as needed for sleep -Started Ativan taper for alcohol use disorder  Robet Chiquito, NP 04/21/2024, 11:13 AM

## 2024-04-21 NOTE — ED Notes (Signed)
 Patient was offered dinner, but patient declined. Patient was informed to let staff know when he was ready to eat

## 2024-04-21 NOTE — ED Notes (Signed)
Patient observed/assessed at bedside lying in bed asleep. Patient alert and oriented to self and location. Affect is flat and patient seems agitated to be disturbed from sleeping. Patient denies pain and anxiety. He denies A/V/H. He denies having any thoughts/plan of self harm and harm towards others. Fluid and snack offered. Patient states that appetite has been good throughout the day. Verbalizes no further complaints at this time. Will continue to monitor and support.

## 2024-04-22 ENCOUNTER — Encounter (HOSPITAL_COMMUNITY): Payer: Self-pay | Admitting: Psychiatry

## 2024-04-22 DIAGNOSIS — F1910 Other psychoactive substance abuse, uncomplicated: Secondary | ICD-10-CM | POA: Diagnosis not present

## 2024-04-22 DIAGNOSIS — F1210 Cannabis abuse, uncomplicated: Secondary | ICD-10-CM | POA: Diagnosis present

## 2024-04-22 DIAGNOSIS — F1110 Opioid abuse, uncomplicated: Secondary | ICD-10-CM | POA: Diagnosis present

## 2024-04-22 LAB — URINALYSIS, COMPLETE (UACMP) WITH MICROSCOPIC
Bilirubin Urine: NEGATIVE
Glucose, UA: NEGATIVE mg/dL
Hgb urine dipstick: NEGATIVE
Ketones, ur: 20 mg/dL — AB
Leukocytes,Ua: NEGATIVE
Nitrite: NEGATIVE
Protein, ur: NEGATIVE mg/dL
Specific Gravity, Urine: 1.009 (ref 1.005–1.030)
pH: 6 (ref 5.0–8.0)

## 2024-04-22 LAB — HEMOGLOBIN A1C
Hgb A1c MFr Bld: 5.1 % (ref 4.8–5.6)
Mean Plasma Glucose: 100 mg/dL

## 2024-04-22 NOTE — Discharge Planning (Signed)
 LCSW met with patient to assess current mood, affect, physical state, and inquire about needs/goals while here in [facility] and after discharge. Patient reports he presented to [facility] under involuntary commitment (IVC) via Sheriff's Department due to an argument with his brother regarding plans to pursue placement at [facility] residential program. Patient reports he has been living with his mother despite a year when staying with his child's mother. Patient reports escalation in angered moods and outbursts along with drinking 1 pint of alcohol (jim beam) daily and usually when off work at night or not working. 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 Patient denies having access to transportation, and reports having limited social support. Patient reports his current goal is to seek residential placement. Patient denies any prior history of outpatient or inpatient substance abuse treatment. Patient currently denies any SI/HI/AVH. Patient aware that LCSW will send referrals out for review and will follow up to provide updates as received. Patient expressed understanding and appreciation of LCSW assistance. No other needs were reported at this time by patient.

## 2024-04-22 NOTE — ED Notes (Signed)
 Patient is resting with eyes closed. Respirations present.

## 2024-04-22 NOTE — Group Note (Signed)
 Group Topic: Change and Accountability  Group Date: 04/22/2024 Start Time: 0730 End Time: 0800 Facilitators: Laquan Ludden , Lucian Rust, NT  Department: [facility]  Number of Participants: 8  Group Focus: acceptance, check in, and coping skills Treatment Modality:  Dialectical Behavioral Therapy Interventions utilized were clarification, confrontation, exploration, and support Purpose: express feelings, improve communication skills, increase insight, and regain self-worth  During our wrap up group from 19:30-20:00 we talked about change and accountability, reflecting on situations where we may have not taken full accountability and also how to prepare for difficult changes as we progress through life and as they progress through their recovery. Everyone participated well and enjoyed the counseling activity.  Name: Louis Martinez Date of Birth: [birth year]  MR: [id number]    Level of Participation: minimal Quality of Participation: attentive and cooperative Interactions with others: gave feedback Mood/Affect: appropriate Triggers (if applicable): N/A Cognition: coherent/clear, concrete, insightful, and logical Progress: Moderate Response: "It is time for me to focus on what I came in here for and to get better so that I can gain peace. Plan: patient will be encouraged to, stay focused on his self improvement.  Patients Problems:  Patient Active Problem List   Diagnosis Date Noted   Cannabis abuse 04/22/2024   Opioid abuse (HCC) 04/22/2024   Substance induced mood disorder (HCC) 04/21/2024   Abnormal glucose 09/27/2019   Elevated BP without diagnosis of hypertension 09/27/2019   Hip flexor tightness 05/26/2018   Hyperlipidemia, mixed    Vitamin D  deficiency    Alcohol dependence (HCC) 09/14/2012

## 2024-04-22 NOTE — ED Provider Notes (Signed)
 Facility Based Crisis Admission H&P  Date: 04/22/24 Patient Name: Louis Martinez MRN: [id number] Chief Complaint: "when I get aggressive, like a wild animal"  Diagnoses:  Final diagnoses:  Polysubstance abuse (HCC)  Alcohol dependence with withdrawal with complication (HCC)    HPI: 38 YO M with history of alcohol use disorder, depression and anxiety who was placed on IVC by family for threats to self and others in the setting of escalating alcohol and other substance use. Patient also now has a 50B from mother.   On interview, the patient is dismissive of recent events. He admits that he has been drinking daily since his mid twenties, and also uses daily cannabis. He recently hurt his back and has been getting opioids from his ex. He explains that he has been destroying things "but I never hit anyone". He has not been sleeping well and sometimes is up all night with "my brain wont stop racing". He says that he is not drinking to sleep, but because alcohol "makes it not so mundane". He has thought about suicide "but I don't think of it as an actual possibility". He is not having hallucinations currently. He is not having homicidal ideations currently. He is debating going to rehab.   PHQ 2-9:   Flowsheet Row ED from 04/21/2024 in [facility] ED from 04/20/2024 in [facility] UC from 09/04/2023 in [facility] at [facility])  C-SSRS RISK CATEGORY No Risk No Risk No Risk       Screenings    Flowsheet Row Most Recent Value  CIWA-Ar Total 4       Total Time spent with patient: 45 minutes  Musculoskeletal  Strength & Muscle Tone: within normal limits Gait & Station: normal Patient leans: N/A  Psychiatric Specialty Exam  Presentation General Appearance:  Fairly Groomed  Eye Contact: Fair  Speech: Clear and Coherent  Speech Volume: Normal  Handedness: Right   Mood and Affect   Mood: Depressed; Anxious  Affect: Congruent   Thought Process  Thought Processes: Coherent  Descriptions of Associations:Intact  Orientation:Full (Time, Place and Person)  Thought Content:Logical  Diagnosis of Schizophrenia or Schizoaffective disorder in past: No   Hallucinations:Hallucinations: None  Ideas of Reference:None  Suicidal Thoughts:Suicidal Thoughts: No SI Passive Intent and/or Plan: Without Intent; Without Plan  Homicidal Thoughts:Homicidal Thoughts: No HI Passive Intent and/or Plan: Without Intent; Without Plan   Sensorium  Memory: Immediate Fair  Judgment: Fair  Insight: Fair   Chartered certified accountant: Fair  Attention Span: Fair  Recall: [employer] of Knowledge: Fair  Language: Fair   Psychomotor Activity  Psychomotor Activity: Psychomotor Activity: Normal   Assets  Assets: Resilience; Social Support   Sleep  Sleep: Sleep: Fair   Nutritional Assessment (For OBS and FBC admissions only) Has the patient had a weight loss or gain of 10 pounds or more in the last 3 months?: No Has the patient had a decrease in food intake/or appetite?: No Does the patient have dental problems?: No Does the patient have eating habits or behaviors that may be indicators of an eating disorder including binging or inducing vomiting?: No Has the patient recently lost weight without trying?: 0 Has the patient been eating poorly because of a decreased appetite?: 0 Malnutrition Screening Tool Score: 0    Physical Exam Vitals and nursing note reviewed.  Constitutional:      Appearance: Normal appearance.  HENT:     Head: Normocephalic.  Eyes:     Extraocular Movements: Extraocular movements intact.  Pulmonary:     Effort: Pulmonary effort is normal.  Musculoskeletal:        General: Normal range of motion.     Cervical back: Normal range of motion.  Neurological:     General: No focal deficit present.     Mental Status:  He is oriented to person, place, and time.  Psychiatric:        Mood and Affect: Mood is depressed.        Cognition and Memory: Cognition normal.    Review of Systems  Constitutional:  Negative for chills and fever.  Respiratory:  Negative for cough and shortness of breath.   Cardiovascular:  Negative for chest pain.  Gastrointestinal:  Negative for constipation, diarrhea, nausea and vomiting.  Genitourinary:  Negative for dysuria.  Musculoskeletal:  Positive for back pain. Negative for myalgias.  Skin:  Negative for rash.  Neurological:  Negative for tingling, tremors and headaches.  Psychiatric/Behavioral:  Negative for hallucinations and suicidal ideas.     Blood pressure (!) 144/92, pulse (!) 120, temperature 98.1 F (36.7 C), temperature source Oral, resp. rate 18, SpO2 100%. There is no height or weight on file to calculate BMI.  Past Psychiatric History: previously diagnosed with alcohol use disorder, depression and anxiety No inpatient treatment. No current therapist or psychiatrist.  Denies previous suicide attempts.   Has previously been on trazodone , lexapro  and buspar .   Is the patient at risk to self? No  Has the patient been a risk to self in the past 6 months? Yes .    Has the patient been a risk to self within the distant past? No   Is the patient a risk to others? No   Has the patient been a risk to others in the past 6 months? Yes   Has the patient been a risk to others within the distant past? Yes   Past Medical History: back pain, vitamin D  deficiency, HLD Family History: denies Social History: currently lives with mother and stepfather. Some college. Works as a Financial risk analyst. Single, has 2 children. No firearms.  Last Labs:  Admission on 04/21/2024  Component Date Value Ref Range Status   WBC 04/21/2024 9.6  4.0 - 10.5 K/uL Final   RBC 04/21/2024 4.39  4.22 - 5.81 MIL/uL Final   Hemoglobin 04/21/2024 14.8  13.0 - 17.0 g/dL Final   HCT 19/14/7829 41.5  39.0 -  52.0 % Final   MCV 04/21/2024 94.5  80.0 - 100.0 fL Final   MCH 04/21/2024 33.7  26.0 - 34.0 pg Final   MCHC 04/21/2024 35.7  30.0 - 36.0 g/dL Final   RDW 56/21/3086 12.2  11.5 - 15.5 % Final   Platelets 04/21/2024 226  150 - 400 K/uL Final   nRBC 04/21/2024 0.0  0.0 - 0.2 % Final   Performed at [facility], [address], [place], [place] [address]   Color, Urine 04/21/2024 YELLOW  YELLOW Final   APPearance 04/21/2024 HAZY (A)  CLEAR Final   Specific Gravity, Urine 04/21/2024 1.009  1.005 - 1.030 Final   pH 04/21/2024 6.0  5.0 - 8.0 Final   Glucose, UA 04/21/2024 NEGATIVE  NEGATIVE mg/dL Final   Hgb urine dipstick 04/21/2024 NEGATIVE  NEGATIVE Final   Bilirubin Urine 04/21/2024 NEGATIVE  NEGATIVE Final   Ketones, ur 04/21/2024 20 (A)  NEGATIVE mg/dL Final   Protein, ur 96/29/5284 NEGATIVE  NEGATIVE mg/dL Final  Nitrite 04/21/2024 NEGATIVE  NEGATIVE Final   Leukocytes,Ua 04/21/2024 NEGATIVE  NEGATIVE Final   RBC / HPF 04/21/2024 0-5  0 - 5 RBC/hpf Final   WBC, UA 04/21/2024 0-5  0 - 5 WBC/hpf Final   Bacteria, UA 04/21/2024 RARE (A)  NONE SEEN Final   Squamous Epithelial / HPF 04/21/2024 6-10  0 - 5 /HPF Final   Performed at [facility], [address], [place], [place] [address]   Vitamin B-12 04/21/2024 517  180 - 914 pg/mL Final   Comment: (NOTE) This assay is not validated for testing neonatal or myeloproliferative syndrome specimens for Vitamin B12 levels. Performed at [facility], [address] [place], [place] [address]    Vit D, 25-Hydroxy 04/21/2024 39.05  30 - 100 ng/mL Final   Comment: (NOTE) Vitamin D  deficiency has been defined by the Institute of Medicine  and an Endocrine Society practice guideline as a level of serum 25-OH  vitamin D  less than 20 ng/mL (1,2). The Endocrine Society went on to  further define vitamin D  insufficiency as a level between 21 and 29  ng/mL (2).  1. IOM (Institute of Medicine). 2010. Dietary reference intakes  for  calcium and D. [place]: [employer]. 2. Holick MF, Binkley [place], Bischoff-Ferrari HA, et al. Evaluation,  treatment, and prevention of vitamin D  deficiency: an Endocrine  Society clinical practice guideline, JCEM. 2011 Jul; 96(7): 1911-30.  Performed at [facility], [address], [place], [place] [address]   Admission on 04/20/2024, Discharged on 04/21/2024  Component Date Value Ref Range Status   WBC 04/20/2024 18.2 (H)  4.0 - 10.5 K/uL Final   RBC 04/20/2024 4.76  4.22 - 5.81 MIL/uL Final   Hemoglobin 04/20/2024 16.2  13.0 - 17.0 g/dL Final   HCT 21/30/8657 45.3  39.0 - 52.0 % Final   MCV 04/20/2024 95.2  80.0 - 100.0 fL Final   MCH 04/20/2024 34.0  26.0 - 34.0 pg Final   MCHC 04/20/2024 35.8  30.0 - 36.0 g/dL Final   RDW 84/69/6295 12.1  11.5 - 15.5 % Final   Platelets 04/20/2024 303  150 - 400 K/uL Final   nRBC 04/20/2024 0.0  0.0 - 0.2 % Final   Neutrophils Relative % 04/20/2024 90  % Final   Neutro Abs 04/20/2024 16.5 (H)  1.7 - 7.7 K/uL Final   Lymphocytes Relative 04/20/2024 6  % Final   Lymphs Abs 04/20/2024 1.0  0.7 - 4.0 K/uL Final   Monocytes Relative 04/20/2024 3  % Final   Monocytes Absolute 04/20/2024 0.6  0.1 - 1.0 K/uL Final   Eosinophils Relative 04/20/2024 0  % Final   Eosinophils Absolute 04/20/2024 0.0  0.0 - 0.5 K/uL Final   Basophils Relative 04/20/2024 0  % Final   Basophils Absolute 04/20/2024 0.0  0.0 - 0.1 K/uL Final   Immature Granulocytes 04/20/2024 1  % Final   Abs Immature Granulocytes 04/20/2024 0.14 (H)  0.00 - 0.07 K/uL Final   Performed at [facility], [address], [place], [place] [address]   Sodium 04/20/2024 137  135 - 145 mmol/L Final   Potassium 04/20/2024 3.8  3.5 - 5.1 mmol/L Final   Chloride 04/20/2024 101  98 - 111 mmol/L Final   CO2 04/20/2024 22  22 - 32 mmol/L Final   Glucose, Bld 04/20/2024 101 (H)  70 - 99 mg/dL Final   Glucose reference range applies only to samples taken  after  fasting for at least 8 hours.   BUN 04/20/2024 8  6 - 20 mg/dL Final   Creatinine, Ser 04/20/2024 0.74  0.61 - 1.24 mg/dL Final   Calcium 40/98/1191 9.5  8.9 - 10.3 mg/dL Final   Total Protein 47/82/9562 7.2  6.5 - 8.1 g/dL Final   Albumin 13/07/6577 4.5  3.5 - 5.0 g/dL Final   AST 46/96/2952 30  15 - 41 U/L Final   ALT 04/20/2024 45 (H)  0 - 44 U/L Final   Alkaline Phosphatase 04/20/2024 72  38 - 126 U/L Final   Total Bilirubin 04/20/2024 1.0  0.0 - 1.2 mg/dL Final   GFR, Estimated 04/20/2024 >60  >60 mL/min Final   Comment: (NOTE) Calculated using the CKD-EPI Creatinine Equation (2021)    Anion gap 04/20/2024 14  5 - 15 Final   Performed at [facility], [address] [place], [place] [address]   Hgb A1c MFr Bld 04/20/2024 5.1  4.8 - 5.6 % Final   Comment: (NOTE)         Prediabetes: 5.7 - 6.4         Diabetes: >6.4         Glycemic control for adults with diabetes: <7.0    Mean Plasma Glucose 04/20/2024 100  mg/dL Final   Comment: (NOTE) Performed At: [facility] [address] [place], [place] [id number] Pearlean Botts MD DG:[id number]    Alcohol, Ethyl (B) 04/20/2024 48 (H)  <15 mg/dL Final   Comment: Please note change in reference range. (NOTE) For medical purposes only. Performed at [facility], [address] [place], [place] [address]    Cholesterol 04/20/2024 252 (H)  0 - 200 mg/dL Final   Triglycerides 75/64/3329 220 (H)  <150 mg/dL Final   HDL 51/88/4166 60  >40 mg/dL Final   Total CHOL/HDL Ratio 04/20/2024 4.2  RATIO Final   VLDL 04/20/2024 44 (H)  0 - 40 mg/dL Final   LDL Cholesterol 04/20/2024 148 (H)  0 - 99 mg/dL Final   Comment:        Total Cholesterol/HDL:CHD Risk Coronary Heart Disease Risk Table                     Men   Women  1/2 Average Risk   3.4   3.3  Average Risk       5.0   4.4  2 X Average Risk   9.6   7.1  3 X Average Risk  23.4   11.0        Use the calculated Patient Ratio above and the CHD Risk Table to  determine the patient's CHD Risk.        ATP III CLASSIFICATION (LDL):  <100     mg/dL   Optimal  063-016  mg/dL   Near or Above                    Optimal  130-159  mg/dL   Borderline  010-932  mg/dL   High  >355     mg/dL   Very High Performed at [facility], [address], [place], [place] [address]    TSH 04/20/2024 1.266  0.350 - 4.500 uIU/mL Final   Comment: Performed by a 3rd Generation assay with a functional sensitivity of <=0.01 uIU/mL. Performed at [facility], [address] [place], [place] [address]    Color, Urine 04/20/2024 STRAW (A)  YELLOW Final   APPearance 04/20/2024 CLEAR  CLEAR Final   Specific Gravity, Urine 04/20/2024 1.003 (L)  1.005 - 1.030 Final   pH 04/20/2024 6.0  5.0 - 8.0 Final   Glucose, UA 04/20/2024 NEGATIVE  NEGATIVE mg/dL Final   Hgb urine dipstick 04/20/2024 NEGATIVE  NEGATIVE Final   Bilirubin Urine 04/20/2024 NEGATIVE  NEGATIVE Final   Ketones, ur 04/20/2024 NEGATIVE  NEGATIVE mg/dL Final   Protein, ur 69/62/9528 NEGATIVE  NEGATIVE mg/dL Final   Nitrite 41/32/4401 NEGATIVE  NEGATIVE Final   Leukocytes,Ua 04/20/2024 TRACE (A)  NEGATIVE Final   RBC / HPF 04/20/2024 0-5  0 - 5 RBC/hpf Final   WBC, UA 04/20/2024 0-5  0 - 5 WBC/hpf Final   Bacteria, UA 04/20/2024 RARE (A)  NONE SEEN Final   Squamous Epithelial / HPF 04/20/2024 0-5  0 - 5 /HPF Final   Performed at [facility], [address], [place], [place] [address]   POC Amphetamine UR 04/20/2024 None Detected  NONE DETECTED (Cut Off Level 1000 ng/mL) Final   POC Secobarbital (BAR) 04/20/2024 None Detected  NONE DETECTED (Cut Off Level 300 ng/mL) Final   POC Buprenorphine (BUP) 04/20/2024 None Detected  NONE DETECTED (Cut Off Level 10 ng/mL) Final   POC Oxazepam (BZO) 04/20/2024 None Detected  NONE DETECTED (Cut Off Level 300 ng/mL) Final   POC Cocaine UR 04/20/2024 None Detected  NONE DETECTED (Cut Off Level 300 ng/mL) Final   POC Methamphetamine UR 04/20/2024 None  Detected  NONE DETECTED (Cut Off Level 1000 ng/mL) Final   POC Morphine 04/20/2024 None Detected  NONE DETECTED (Cut Off Level 300 ng/mL) Final   POC Methadone UR 04/20/2024 None Detected  NONE DETECTED (Cut Off Level 300 ng/mL) Final   POC Oxycodone UR 04/20/2024 Positive (A)  NONE DETECTED (Cut Off Level 100 ng/mL) Final   POC Marijuana UR 04/20/2024 Positive (A)  NONE DETECTED (Cut Off Level 50 ng/mL) Final    Allergies: Patient has no known allergies.  Medications:  Facility Ordered Medications  Medication   acetaminophen  (TYLENOL ) tablet 650 mg   alum & mag hydroxide-simeth (MAALOX/MYLANTA) 200-200-20 MG/5ML suspension 30 mL   escitalopram  (LEXAPRO ) tablet 10 mg   haloperidol (HALDOL) tablet 5 mg   And   diphenhydrAMINE (BENADRYL) capsule 50 mg   haloperidol lactate (HALDOL) injection 10 mg   And   diphenhydrAMINE (BENADRYL) injection 50 mg   And   LORazepam (ATIVAN) injection 2 mg   haloperidol lactate (HALDOL) injection 5 mg   And   diphenhydrAMINE (BENADRYL) injection 50 mg   And   LORazepam (ATIVAN) injection 2 mg   hydrOXYzine (ATARAX) tablet 25 mg   loperamide (IMODIUM) capsule 2-4 mg   [COMPLETED] LORazepam (ATIVAN) tablet 1 mg   Followed by   [COMPLETED] LORazepam (ATIVAN) tablet 1 mg   Followed by   LORazepam (ATIVAN) tablet 1 mg   Followed by   Cecily Cohen ON 04/24/2024] LORazepam (ATIVAN) tablet 1 mg   magnesium hydroxide (MILK OF MAGNESIA) suspension 30 mL   multivitamin with minerals tablet 1 tablet   ondansetron (ZOFRAN-ODT) disintegrating tablet 4 mg   thiamine (VITAMIN B1) tablet 100 mg   traZODone  (DESYREL ) tablet 50 mg   acetaminophen  (TYLENOL ) tablet 650 mg   haloperidol (HALDOL) tablet 5 mg   And   diphenhydrAMINE (BENADRYL) capsule 50 mg   haloperidol lactate (HALDOL) injection 5 mg   And   diphenhydrAMINE (BENADRYL) injection 50 mg   And   LORazepam (ATIVAN) injection  2 mg   haloperidol lactate (HALDOL) injection 10 mg   And   diphenhydrAMINE  (BENADRYL) injection 50 mg   And   LORazepam (ATIVAN) injection 2 mg   traZODone  (DESYREL ) tablet 100 mg   PTA Medications  Medication Sig   escitalopram  (LEXAPRO ) 20 MG tablet Take 1 tablet by mouth Daily for Mood (Patient taking differently: Take 20 mg by mouth daily. Take 1 tablet by mouth Daily for Mood)   methocarbamol (ROBAXIN) 500 MG tablet Take 500 mg by mouth 3 (three) times daily as needed for muscle spasms.   methylPREDNISolone  (MEDROL  DOSEPAK) 4 MG TBPK tablet Take 4-8 mg by mouth See admin instructions. Take as directed on package for 6 days starting on 04/18/24 (Patient not taking: Reported on 04/21/2024)    Long Term Goals: Improvement in symptoms so as ready for discharge  Short Term Goals: Patient will verbalize feelings in meetings with treatment team members., Patient will attend at least of 50% of the groups daily., Pt will complete the PHQ9 on admission, day 3 and discharge., Patient will participate in completing the [place] Suicide Severity Rating Scale, Patient will score a low risk of violence for 24 hours prior to discharge, and Patient will take medications as prescribed daily.  Medical Decision Making  Patient IVC for verbal threats to self and others, significant use of alcohol, but also had a history of anxiety and depression. Since he started problematic drinking 15 years ago, it is difficult to say which came first. Will continue IVC so that taper can be finished safely, as he appears to be conflicted about cessation of alcohol, opioids and cannabis.     Recommendations  Long Term Goals: Improvement in symptoms so as ready for discharge   Short Term Goals: Patient will verbalize feelings in meetings with treatment team members., Patient will attend at least of 50% of the groups daily., Pt will complete the PHQ9 on admission, day 3 and discharge., and Patient will take medications as prescribed daily.  Medications: Mood/anxiety: continue group therapy, milieu  therapy, 1:1 evaluation with provider.  Medication management: trazodone  100mg  PO at bedtime for sleep. Lexapro  10mg  PO daily for mood and anxiety.   Substance Abuse:  brief intervention provided abstinence advised.  opioids: COWS monitoring with coverage of withdrawal symptoms.  Severe alcohol use disorder: replacing thiamine. Encourage adequate PO intake. May require lab monitoring of electrolytes. Benzodiazepine taper as tolerated. Seizure precautions. Monitor for delirium tremens.  Cannabis use disorder: Encouraging insight. Education provided on the effect of cannabis on mental health. Coverage for withdrawal symptoms and nausea.  Nicotine and tobacco use: N/A Medical: PRNs for pain, constipation, indigestion available.  Labs/studies: labs reviewed. HLD still present. LFT mildly elevated.  Safety and Monitoring: involuntarily admission to [facility]/Facility based care unit [facility]) unit for safety, stabilization and treatment Daily contact with patient to assess and evaluate symptoms and progress in treatment Patient's case to be discussed in multi-disciplinary team meeting Observation Level : q15 minute checks Vital signs: q12 hours Precautions: withdrawal, seizure, and elopement  Based on my evaluation the patient does not appear to have an emergency medical condition.   Floyce Hutching, MD 04/22/24  4:48 PM

## 2024-04-22 NOTE — Group Note (Signed)
 Group Topic: Recovery Basics  Group Date: 04/22/2024 Start Time: 1400 End Time: 1420 Facilitators: Arlan Belling, RN  Department: [facility]  Number of Participants: 10  Group Focus: chemical dependency issues Treatment Modality:  Behavior Modification Therapy Interventions utilized were exploration, group exercise, and mental fitness Purpose: express feelings, express irrational fears, improve communication skills, increase insight, and regain self-worth  Name: Louis Martinez Date of Birth: December 07, 1986  MR: [id number]    Level of Participation: minimal Quality of Participation: uncooperative Interactions with others: gave feedback Mood/Affect: blunted Triggers (if applicable):   Cognition: concrete and no insight Progress: None Response:   Plan: follow-up needed  Patients Problems:  Patient Active Problem List   Diagnosis Date Noted   Cannabis abuse 04/22/2024   Opioid abuse (HCC) 04/22/2024   Substance induced mood disorder (HCC) 04/21/2024   Abnormal glucose 09/27/2019   Elevated BP without diagnosis of hypertension 09/27/2019   Hip flexor tightness 05/26/2018   Hyperlipidemia, mixed    Vitamin D  deficiency    Alcohol dependence (HCC) 09/14/2012

## 2024-04-22 NOTE — Group Note (Signed)
 Group Topic: Wellness  Group Date: 04/22/2024 Start Time: 1130 End Time: 1200 Facilitators: Chipper Council, NT  Department: [facility]  Number of Participants: 9  Group Focus: other Nutrition Treatment Modality:  Psychoeducation Interventions utilized were patient education Purpose: increase insight  Name: Louis Martinez Date of Birth: [birth year]  MR: [id number]    Level of Participation: active Quality of Participation: attentive Interactions with others: gave feedback Mood/Affect: appropriate Triggers (if applicable): n/a Cognition: coherent/clear Progress: Gaining insight Response: n/a Plan: patient will be encouraged to attend groups  Patients Problems:  Patient Active Problem List   Diagnosis Date Noted   Cannabis abuse 04/22/2024   Opioid abuse (HCC) 04/22/2024   Substance induced mood disorder (HCC) 04/21/2024   Abnormal glucose 09/27/2019   Elevated BP without diagnosis of hypertension 09/27/2019   Hip flexor tightness 05/26/2018   Hyperlipidemia, mixed    Vitamin D  deficiency    Alcohol dependence (HCC) 09/14/2012

## 2024-04-22 NOTE — ED Notes (Signed)
 Patient currently in room with [employer] sheriff for restraining order service.

## 2024-04-22 NOTE — ED Notes (Signed)
Patient is sleeping. Respirations equal and unlabored, skin warm and dry, NAD. No change in assessment or acuity. Routine safety checks conducted according to facility protocol. Will continue to monitor for safety.   

## 2024-04-22 NOTE — ED Notes (Signed)
 Patient A&O x 4, appears calmer than usual. Facial expression flat without intensity. Patient engaged in self-directed and grouo activities. Patient denies SI, HI, AVH. Patient does not appear to be responding to internal stimuli.

## 2024-04-22 NOTE — ED Notes (Signed)
 Patient visibly upset and anxious r/t being served a restraining order from his mother. Patient continues to deny family allegations. Patient observed sitting in the seats near the nurses station with inability to maintain stillness, continuous leg moving and heavy breathing. Patient also appears to be holding back tears. Patient requested to consult with the S.W. to discuss next steps in treatment. Hydroxyzine administered for anxiousness P- 120 B/P 144/92.

## 2024-04-22 NOTE — ED Notes (Signed)
 Provider aware of elevated pulse and B/P with accompanying issue.

## 2024-04-22 NOTE — ED Notes (Signed)
 Patient A&O x 4. Patient remains has a sullen facial expression with congruent affect. Patient appears argumentative, guarded, and sarcastic with communication. Patient denies SI, HI, AVH. Patient expresses he is irritated because he feels restricted and constricted. Supportive listening and medication provided. Patient expresses desire to fix his problem but is unaware of how. Patient encouraged to try to be active with self-care.

## 2024-04-23 DIAGNOSIS — F1910 Other psychoactive substance abuse, uncomplicated: Secondary | ICD-10-CM | POA: Diagnosis not present

## 2024-04-23 NOTE — [facility] Group Notes (Signed)
 SPIRITUALITY GROUP NOTE  Spirituality group facilitated by Chaplain Zacharius Funari, MDiv, [facility].  Group Description:  Group focused on topic of hope.  Patients participated in facilitated discussion around topic, connecting with one another around experiences and definitions for hope.  Group members engaged with visual explorer photos, reflecting on what hope looks like for them today.  Group engaged in discussion around how their definitions of hope are present today in hospital.   Modalities: Psycho-social ed, Adlerian, Narrative, MI Patient Progress: Arrived to group late.  Introduced self as Engineer, water" when engaged by facilitator.  Declined to engage in group.  Left group and did not return.

## 2024-04-23 NOTE — ED Notes (Signed)
 Patient in the bedroom calm and sleeping. NAD Environment secured. Will keep monitoring for safety.

## 2024-04-23 NOTE — ED Notes (Signed)
 Patient is sleeping. Respirations equal and unlabored, skin warm and dry. No change in assessment or acuity. Routine safety checks conducted according to facility protocol. Will continue to monitor for safety.

## 2024-04-23 NOTE — Discharge Planning (Signed)
 SW [facility] from [facility] and they denied approval for their residential program due to patients recent SI and felt his mental health concerns superceded his alcohol abuse concerns.  SW also received call from [facility] and they were unable to make offer for patient due to his insurance with [employer]. Will continue to make referrals for residential care.

## 2024-04-23 NOTE — ED Notes (Signed)
 Patient refused snack.

## 2024-04-23 NOTE — ED Notes (Signed)
 Patient alert & oriented x4. Denies intent to harm self or others when asked. Denies A/VH. Patient denies any physical complaints when asked. No acute distress noted. Scheduled medications administered with no complications. Support and encouragement provided. Routine safety checks conducted per facility protocol. Encouraged patient to notify staff if any thoughts of harm towards self or others arise. Patient verbalizes understanding and agreement.

## 2024-04-23 NOTE — Group Note (Signed)
 Group Topic: Understanding Self  Group Date: 04/23/2024 Start Time: 1430 End Time: 1515 Facilitators: Dennis Fitting, NT  Department: [facility]  Number of Participants: 4  Group Focus: coping skills and relaxation Treatment Modality:  Psychoeducation Interventions utilized were exploration, patient education, story telling, and support Purpose: enhance coping skills, increase insight, and reinforce self-care  Name: Louis Martinez Date of Birth: 1986-01-20  MR: [id number]    Level of Participation: withdrawn Quality of Participation: isolative, quiet, and withdrawn Interactions with others: PT did not interact with others during group Mood/Affect: closed / guarded Triggers (if applicable): N/A Cognition: rigid Progress: Gaining insight Response: Patient did not communicate verbally with others during group.  Plan: patient will be encouraged to participate in groups in order to gain psychoeducation skills   Patients Problems:  Patient Active Problem List   Diagnosis Date Noted   Cannabis abuse 04/22/2024   Opioid abuse (HCC) 04/22/2024   Substance induced mood disorder (HCC) 04/21/2024   Abnormal glucose 09/27/2019   Elevated BP without diagnosis of hypertension 09/27/2019   Hip flexor tightness 05/26/2018   Hyperlipidemia, mixed    Vitamin D  deficiency    Alcohol dependence (HCC) 09/14/2012

## 2024-04-23 NOTE — ED Notes (Signed)
 Patient sitting in bedroom, calm and composed. No acute distress noted. No concerns voiced. Informed patient to notify staff with any needs or assistance. Patient verbalized understanding or agreement. Safety checks in place per facility policy.

## 2024-04-23 NOTE — Group Note (Signed)
 Group Topic: Identity and Relationships  Group Date: 04/23/2024 Start Time: 2015 End Time: 2030 Facilitators: Soila Dunnings  Department: [facility]  Number of Participants: 5  Group Focus: acceptance, coping skills, feeling awareness/expression, healthy friendships, and reminiscence Treatment Modality:  Leisure Development Interventions utilized were leisure development Purpose: enhance coping skills, express feelings, and improve communication skills  Name: Louis Martinez Date of Birth: 12-01-1986  MR: [id number]    Level of Participation:  pt did not attend group  Quality of Participation: pt did not attend group  Interactions with others: positive Mood/Affect: appropriate and positive Triggers (if applicable): n/a Cognition: coherent/clear Progress: Gaining insight Response: pt wanted to engage in independent activities in his room (reading, drawing, games)  Plan: patient will be encouraged to attend groups   Patients Problems:  Patient Active Problem List   Diagnosis Date Noted   Cannabis abuse 04/22/2024   Opioid abuse (HCC) 04/22/2024   Substance induced mood disorder (HCC) 04/21/2024   Abnormal glucose 09/27/2019   Elevated BP without diagnosis of hypertension 09/27/2019   Hip flexor tightness 05/26/2018   Hyperlipidemia, mixed    Vitamin D  deficiency    Alcohol dependence (HCC) 09/14/2012

## 2024-04-23 NOTE — Discharge Planning (Signed)
 SW spoke with patient regarding referrals and [facility] and [facility] have both approved his clinical and insurance and are requesting pre-admission screening and for him to call them. Patient provided a number for [facility] [place] that his parents found which accept his insurance. SW will follow up and send referral information and continue to follow.

## 2024-04-23 NOTE — ED Provider Notes (Signed)
 Behavioral Health Progress Note  Date and Time: 04/23/2024 2:52 PM Name: Louis Martinez MRN:  [id number]  Subjective:  Louis Martinez was seen in his room on rounds. He is concerned about discharge planning. He wants to spend time with his kids before the goes to residential rehab. He is sleeping okay but appetite is not very good today. He engages in some blaming and triangulation regarding recent events. He blames his brother for the current situation, and feels that he orchestrated things against him. He does not take responsibility, but is willing to have treatment.   Diagnosis:  Final diagnoses:  Polysubstance abuse (HCC)  Alcohol dependence with withdrawal with complication (HCC)    Total Time spent with patient: 20 minutes    Past Psychiatric History: previously diagnosed with alcohol use disorder, depression and anxiety No inpatient treatment. No current therapist or psychiatrist.  Denies previous suicide attempts.   Has previously been on trazodone , lexapro  and buspar .  Past Medical History: back pain, vitamin D  deficiency, HLD Family History: denies Social History: currently lives with mother and stepfather. Some college. Works as a Financial risk analyst. Single, has 2 children. No firearms.  Sleep: Good  Appetite:  Poor  Current Medications:  Current Facility-Administered Medications  Medication Dose Route Frequency Provider Last Rate Last Admin   acetaminophen  (TYLENOL ) tablet 650 mg  650 mg Oral Q6H PRN Nkwenti, Doris, NP       acetaminophen  (TYLENOL ) tablet 650 mg  650 mg Oral Q6H PRN Nkwenti, Doris, NP       alum & mag hydroxide-simeth (MAALOX/MYLANTA) 200-200-20 MG/5ML suspension 30 mL  30 mL Oral Q4H PRN Nkwenti, Doris, NP       haloperidol (HALDOL) tablet 5 mg  5 mg Oral TID PRN Robet Chiquito, NP       And   diphenhydrAMINE (BENADRYL) capsule 50 mg  50 mg Oral TID PRN Robet Chiquito, NP       haloperidol (HALDOL) tablet 5 mg  5 mg Oral TID PRN Robet Chiquito, NP       And    diphenhydrAMINE (BENADRYL) capsule 50 mg  50 mg Oral TID PRN Robet Chiquito, NP       haloperidol lactate (HALDOL) injection 10 mg  10 mg Intramuscular TID PRN Robet Chiquito, NP       And   diphenhydrAMINE (BENADRYL) injection 50 mg  50 mg Intramuscular TID PRN Robet Chiquito, NP       And   LORazepam (ATIVAN) injection 2 mg  2 mg Intramuscular TID PRN Robet Chiquito, NP       haloperidol lactate (HALDOL) injection 5 mg  5 mg Intramuscular TID PRN Robet Chiquito, NP       And   diphenhydrAMINE (BENADRYL) injection 50 mg  50 mg Intramuscular TID PRN Robet Chiquito, NP       And   LORazepam (ATIVAN) injection 2 mg  2 mg Intramuscular TID PRN Robet Chiquito, NP       haloperidol lactate (HALDOL) injection 5 mg  5 mg Intramuscular TID PRN Robet Chiquito, NP       And   diphenhydrAMINE (BENADRYL) injection 50 mg  50 mg Intramuscular TID PRN Robet Chiquito, NP       And   LORazepam (ATIVAN) injection 2 mg  2 mg Intramuscular TID PRN Robet Chiquito, NP       haloperidol lactate (HALDOL) injection 10 mg  10 mg Intramuscular TID PRN Robet Chiquito, NP       And   diphenhydrAMINE (BENADRYL)  injection 50 mg  50 mg Intramuscular TID PRN Robet Chiquito, NP       And   LORazepam (ATIVAN) injection 2 mg  2 mg Intramuscular TID PRN Robet Chiquito, NP       escitalopram  (LEXAPRO ) tablet 10 mg  10 mg Oral Daily Nkwenti, Doris, NP   10 mg at 04/23/24 1104   hydrOXYzine (ATARAX) tablet 25 mg  25 mg Oral TID PRN Robet Chiquito, NP   25 mg at 04/22/24 1227   loperamide (IMODIUM) capsule 2-4 mg  2-4 mg Oral PRN Robet Chiquito, NP       Louis Martinez ON 04/24/2024] LORazepam (ATIVAN) tablet 1 mg  1 mg Oral Daily Nkwenti, Doris, NP       magnesium hydroxide (MILK OF MAGNESIA) suspension 30 mL  30 mL Oral Daily PRN Robet Chiquito, NP       multivitamin with minerals tablet 1 tablet  1 tablet Oral Daily Robet Chiquito, NP   1 tablet at 04/23/24 1104   ondansetron (ZOFRAN-ODT) disintegrating tablet 4 mg  4 mg Oral Q6H PRN  Robet Chiquito, NP       thiamine (VITAMIN B1) tablet 100 mg  100 mg Oral Daily Nkwenti, Doris, NP   100 mg at 04/23/24 1104   traZODone  (DESYREL ) tablet 100 mg  100 mg Oral QHS Robet Chiquito, NP   100 mg at 04/22/24 2115   traZODone  (DESYREL ) tablet 50 mg  50 mg Oral QHS PRN Robet Chiquito, NP       Current Outpatient Medications  Medication Sig Dispense Refill   escitalopram  (LEXAPRO ) 20 MG tablet Take 1 tablet by mouth Daily for Mood (Patient taking differently: Take 20 mg by mouth daily. Take 1 tablet by mouth Daily for Mood) 90 tablet 1   methocarbamol (ROBAXIN) 500 MG tablet Take 500 mg by mouth 3 (three) times daily as needed for muscle spasms.     methylPREDNISolone  (MEDROL  DOSEPAK) 4 MG TBPK tablet Take 4-8 mg by mouth See admin instructions. Take as directed on package for 6 days starting on 04/18/24 (Patient not taking: Reported on 04/21/2024)      Labs  Lab Results:  Admission on 04/21/2024  Component Date Value Ref Range Status   WBC 04/21/2024 9.6  4.0 - 10.5 K/uL Final   RBC 04/21/2024 4.39  4.22 - 5.81 MIL/uL Final   Hemoglobin 04/21/2024 14.8  13.0 - 17.0 g/dL Final   HCT 69/62/9528 41.5  39.0 - 52.0 % Final   MCV 04/21/2024 94.5  80.0 - 100.0 fL Final   MCH 04/21/2024 33.7  26.0 - 34.0 pg Final   MCHC 04/21/2024 35.7  30.0 - 36.0 g/dL Final   RDW 41/32/4401 12.2  11.5 - 15.5 % Final   Platelets 04/21/2024 226  150 - 400 K/uL Final   nRBC 04/21/2024 0.0  0.0 - 0.2 % Final   Performed at [facility], [address] [place], [place] [address]   Color, Urine 04/21/2024 YELLOW  YELLOW Final   APPearance 04/21/2024 HAZY (A)  CLEAR Final   Specific Gravity, Urine 04/21/2024 1.009  1.005 - 1.030 Final   pH 04/21/2024 6.0  5.0 - 8.0 Final   Glucose, UA 04/21/2024 NEGATIVE  NEGATIVE mg/dL Final   Hgb urine dipstick 04/21/2024 NEGATIVE  NEGATIVE Final   Bilirubin Urine 04/21/2024 NEGATIVE  NEGATIVE Final   Ketones, ur 04/21/2024 20 (A)  NEGATIVE mg/dL Final   Protein,  ur 36/64/4034 NEGATIVE  NEGATIVE mg/dL Final   Nitrite 74/25/9563  NEGATIVE  NEGATIVE Final   Leukocytes,Ua 04/21/2024 NEGATIVE  NEGATIVE Final   RBC / HPF 04/21/2024 0-5  0 - 5 RBC/hpf Final   WBC, UA 04/21/2024 0-5  0 - 5 WBC/hpf Final   Bacteria, UA 04/21/2024 RARE (A)  NONE SEEN Final   Squamous Epithelial / HPF 04/21/2024 6-10  0 - 5 /HPF Final   Performed at [facility], [address], [place], [place] [address]   Vitamin B-12 04/21/2024 517  180 - 914 pg/mL Final   Comment: (NOTE) This assay is not validated for testing neonatal or myeloproliferative syndrome specimens for Vitamin B12 levels. Performed at [facility], [address] [place], [place] [address]    Vit D, 25-Hydroxy 04/21/2024 39.05  30 - 100 ng/mL Final   Comment: (NOTE) Vitamin D  deficiency has been defined by the Institute of Medicine  and an Endocrine Society practice guideline as a level of serum 25-OH  vitamin D  less than 20 ng/mL (1,2). The Endocrine Society went on to  further define vitamin D  insufficiency as a level between 21 and 29  ng/mL (2).  1. IOM (Institute of Medicine). 2010. Dietary reference intakes for  calcium and D. [place]: [employer]. 2. Holick MF, Binkley Blanford, Bischoff-Ferrari HA, et al. Evaluation,  treatment, and prevention of vitamin D  deficiency: an Endocrine  Society clinical practice guideline, JCEM. 2011 Jul; 96(7): 1911-30.  Performed at [facility], [address] [place] [address], [place], [place] [address]   Admission on 04/20/2024, Discharged on 04/21/2024  Component Date Value Ref Range Status   WBC 04/20/2024 18.2 (H)  4.0 - 10.5 K/uL Final   RBC 04/20/2024 4.76  4.22 - 5.81 MIL/uL Final   Hemoglobin 04/20/2024 16.2  13.0 - 17.0 g/dL Final   HCT 91/47/8295 45.3  39.0 - 52.0 % Final   MCV 04/20/2024 95.2  80.0 - 100.0 fL Final   MCH 04/20/2024 34.0  26.0 - 34.0 pg Final   MCHC 04/20/2024 35.8  30.0 - 36.0 g/dL Final   RDW 62/13/0865 12.1   11.5 - 15.5 % Final   Platelets 04/20/2024 303  150 - 400 K/uL Final   nRBC 04/20/2024 0.0  0.0 - 0.2 % Final   Neutrophils Relative % 04/20/2024 90  % Final   Neutro Abs 04/20/2024 16.5 (H)  1.7 - 7.7 K/uL Final   Lymphocytes Relative 04/20/2024 6  % Final   Lymphs Abs 04/20/2024 1.0  0.7 - 4.0 K/uL Final   Monocytes Relative 04/20/2024 3  % Final   Monocytes Absolute 04/20/2024 0.6  0.1 - 1.0 K/uL Final   Eosinophils Relative 04/20/2024 0  % Final   Eosinophils Absolute 04/20/2024 0.0  0.0 - 0.5 K/uL Final   Basophils Relative 04/20/2024 0  % Final   Basophils Absolute 04/20/2024 0.0  0.0 - 0.1 K/uL Final   Immature Granulocytes 04/20/2024 1  % Final   Abs Immature Granulocytes 04/20/2024 0.14 (H)  0.00 - 0.07 K/uL Final   Performed at [facility], [address] [place], [place] [address]   Sodium 04/20/2024 137  135 - 145 mmol/L Final   Potassium 04/20/2024 3.8  3.5 - 5.1 mmol/L Final   Chloride 04/20/2024 101  98 - 111 mmol/L Final   CO2 04/20/2024 22  22 - 32 mmol/L Final   Glucose, Bld 04/20/2024 101 (H)  70 - 99 mg/dL Final   Glucose reference range applies only to samples taken after fasting  for at least 8 hours.   BUN 04/20/2024 8  6 - 20 mg/dL Final   Creatinine, Ser 04/20/2024 0.74  0.61 - 1.24 mg/dL Final   Calcium 14/78/2956 9.5  8.9 - 10.3 mg/dL Final   Total Protein 21/30/8657 7.2  6.5 - 8.1 g/dL Final   Albumin 84/69/6295 4.5  3.5 - 5.0 g/dL Final   AST 28/41/3244 30  15 - 41 U/L Final   ALT 04/20/2024 45 (H)  0 - 44 U/L Final   Alkaline Phosphatase 04/20/2024 72  38 - 126 U/L Final   Total Bilirubin 04/20/2024 1.0  0.0 - 1.2 mg/dL Final   GFR, Estimated 04/20/2024 >60  >60 mL/min Final   Comment: (NOTE) Calculated using the CKD-EPI Creatinine Equation (2021)    Anion gap 04/20/2024 14  5 - 15 Final   Performed at [facility], [address], [place], [place] [address]   Hgb A1c MFr Bld 04/20/2024 5.1  4.8 - 5.6 % Final   Comment: (NOTE)          Prediabetes: 5.7 - 6.4         Diabetes: >6.4         Glycemic control for adults with diabetes: <7.0    Mean Plasma Glucose 04/20/2024 100  mg/dL Final   Comment: (NOTE) Performed At: [facility] [address] [place], [place] [id number] Pearlean Botts MD KV:[id number]    Alcohol, Ethyl (B) 04/20/2024 48 (H)  <15 mg/dL Final   Comment: Please note change in reference range. (NOTE) For medical purposes only. Performed at [facility], [address] [place], [place] [address]    Cholesterol 04/20/2024 252 (H)  0 - 200 mg/dL Final   Triglycerides 95/18/8416 220 (H)  <150 mg/dL Final   HDL 60/63/0160 60  >40 mg/dL Final   Total CHOL/HDL Ratio 04/20/2024 4.2  RATIO Final   VLDL 04/20/2024 44 (H)  0 - 40 mg/dL Final   LDL Cholesterol 04/20/2024 148 (H)  0 - 99 mg/dL Final   Comment:        Total Cholesterol/HDL:CHD Risk Coronary Heart Disease Risk Table                     Men   Women  1/2 Average Risk   3.4   3.3  Average Risk       5.0   4.4  2 X Average Risk   9.6   7.1  3 X Average Risk  23.4   11.0        Use the calculated Patient Ratio above and the CHD Risk Table to determine the patient's CHD Risk.        ATP III CLASSIFICATION (LDL):  <100     mg/dL   Optimal  109-323  mg/dL   Near or Above                    Optimal  130-159  mg/dL   Borderline  557-322  mg/dL   High  >025     mg/dL   Very High Performed at [facility], [address] [place], [place] [address]    TSH 04/20/2024 1.266  0.350 - 4.500 uIU/mL Final   Comment: Performed by a 3rd Generation assay with a functional sensitivity of <=0.01 uIU/mL. Performed at [facility], [address], [place], [place] [address]    Color, Urine 04/20/2024 STRAW (A)  YELLOW Final  APPearance 04/20/2024 CLEAR  CLEAR Final   Specific Gravity, Urine 04/20/2024 1.003 (L)  1.005 - 1.030 Final   pH 04/20/2024 6.0  5.0 - 8.0 Final   Glucose, UA 04/20/2024 NEGATIVE  NEGATIVE mg/dL Final    Hgb urine dipstick 04/20/2024 NEGATIVE  NEGATIVE Final   Bilirubin Urine 04/20/2024 NEGATIVE  NEGATIVE Final   Ketones, ur 04/20/2024 NEGATIVE  NEGATIVE mg/dL Final   Protein, ur 45/40/9811 NEGATIVE  NEGATIVE mg/dL Final   Nitrite 91/47/8295 NEGATIVE  NEGATIVE Final   Leukocytes,Ua 04/20/2024 TRACE (A)  NEGATIVE Final   RBC / HPF 04/20/2024 0-5  0 - 5 RBC/hpf Final   WBC, UA 04/20/2024 0-5  0 - 5 WBC/hpf Final   Bacteria, UA 04/20/2024 RARE (A)  NONE SEEN Final   Squamous Epithelial / HPF 04/20/2024 0-5  0 - 5 /HPF Final   Performed at [facility], [address] [place], [place] [address]   POC Amphetamine UR 04/20/2024 None Detected  NONE DETECTED (Cut Off Level 1000 ng/mL) Final   POC Secobarbital (BAR) 04/20/2024 None Detected  NONE DETECTED (Cut Off Level 300 ng/mL) Final   POC Buprenorphine (BUP) 04/20/2024 None Detected  NONE DETECTED (Cut Off Level 10 ng/mL) Final   POC Oxazepam (BZO) 04/20/2024 None Detected  NONE DETECTED (Cut Off Level 300 ng/mL) Final   POC Cocaine UR 04/20/2024 None Detected  NONE DETECTED (Cut Off Level 300 ng/mL) Final   POC Methamphetamine UR 04/20/2024 None Detected  NONE DETECTED (Cut Off Level 1000 ng/mL) Final   POC Morphine 04/20/2024 None Detected  NONE DETECTED (Cut Off Level 300 ng/mL) Final   POC Methadone UR 04/20/2024 None Detected  NONE DETECTED (Cut Off Level 300 ng/mL) Final   POC Oxycodone UR 04/20/2024 Positive (A)  NONE DETECTED (Cut Off Level 100 ng/mL) Final   POC Marijuana UR 04/20/2024 Positive (A)  NONE DETECTED (Cut Off Level 50 ng/mL) Final    Blood Alcohol level:  Lab Results  Component Value Date   ETH 48 (H) 04/20/2024    Metabolic Disorder Labs: Lab Results  Component Value Date   HGBA1C 5.1 04/20/2024   MPG 100 04/20/2024   MPG 94 05/07/2023   No results found for: "PROLACTIN" Lab Results  Component Value Date   CHOL 252 (H) 04/20/2024   TRIG 220 (H) 04/20/2024   HDL 60 04/20/2024   CHOLHDL 4.2 04/20/2024    VLDL 44 (H) 04/20/2024   LDLCALC 148 (H) 04/20/2024   LDLCALC 102 (H) 05/07/2023    Therapeutic Lab Levels: No results found for: "LITHIUM" No results found for: "VALPROATE" No results found for: "CBMZ"  Physical Findings   PHQ2-9    Flowsheet Row Office Visit from 07/07/2018 in [facility]  PHQ-2 Total Score 0      Flowsheet Row ED from 04/21/2024 in [facility] ED from 04/20/2024 in [facility] from 09/04/2023 in [facility])  C-SSRS RISK CATEGORY No Risk No Risk No Risk        Musculoskeletal  Strength & Muscle Tone: within normal limits Gait & Station: normal Patient leans: N/A  Psychiatric Specialty Exam  Presentation  General Appearance:  Fairly Groomed  Eye Contact: Fair  Speech: Clear and Coherent  Speech Volume: Normal  Handedness: Right   Mood and Affect  Mood: Depressed; Anxious  Affect: Congruent   Thought Process  Thought Processes: Coherent  Descriptions of Associations:Intact  Orientation:Full (Time, Place and Person)  Thought Content:Logical  Diagnosis of Schizophrenia or Schizoaffective disorder in past: No    Hallucinations:No data recorded Ideas of Reference:None  Suicidal Thoughts:No data recorded Homicidal Thoughts:No data recorded  Sensorium  Memory: Immediate Fair  Judgment: Fair  Insight: Fair   Art therapist  Concentration: Fair  Attention Span: Fair  Recall: [employer] of Knowledge: Fair  Language: Fair   Psychomotor Activity  Psychomotor Activity:No data recorded  Assets  Assets: Resilience; Social Support   Sleep  Sleep:No data recorded  No data recorded  Physical Exam  Physical Exam Vitals and nursing note reviewed.  Constitutional:      Appearance: Normal appearance.  HENT:     Head: Normocephalic.  Eyes:     Extraocular  Movements: Extraocular movements intact.  Pulmonary:     Effort: Pulmonary effort is normal.  Musculoskeletal:        General: Normal range of motion.     Cervical back: Normal range of motion.  Neurological:     General: No focal deficit present.     Mental Status: He is alert and oriented to person, place, and time.  Psychiatric:        Behavior: Behavior normal.    Review of Systems  Constitutional:  Negative for chills and fever.  Gastrointestinal:  Negative for constipation, diarrhea, nausea and vomiting.  Genitourinary:  Negative for dysuria.  Musculoskeletal:  Negative for myalgias.  Neurological:  Negative for tremors.   Blood pressure 121/81, pulse 99, temperature 97.8 F (36.6 C), temperature source Oral, resp. rate 16, SpO2 99%. There is no height or weight on file to calculate BMI.  Treatment Plan Summary: Long Term Goals: Improvement in symptoms so as ready for discharge   Short Term Goals: Patient will verbalize feelings in meetings with treatment team members., Patient will attend at least of 50% of the groups daily., Pt will complete the PHQ9 on admission, day 3 and discharge., and Patient will take medications as prescribed daily.   Medications: Mood/anxiety: continue group therapy, milieu therapy, 1:1 evaluation with provider.  Medication management: trazodone  100mg  PO at bedtime for sleep. Lexapro  10mg  PO daily for mood and anxiety.   Substance Abuse:  brief intervention provided abstinence advised.  opioids: COWS monitoring with coverage of withdrawal symptoms.  Severe alcohol use disorder: replacing thiamine. Encourage adequate PO intake. May require lab monitoring of electrolytes. Benzodiazepine taper as tolerated. Seizure precautions. Monitor for delirium tremens.  Cannabis use disorder: Encouraging insight. Education provided on the effect of cannabis on mental health. Coverage for withdrawal symptoms and nausea.  Nicotine and tobacco use: N/A Medical:  PRNs for pain, constipation, indigestion available.  Labs/studies: none new Safety and Monitoring: involuntarily admission to [facility]/Facility based care unit [facility]) unit for safety, stabilization and treatment Daily contact with patient to assess and evaluate symptoms and progress in treatment Patient's case to be discussed in multi-disciplinary team meeting Observation Level : q15 minute checks Vital signs: q12 hours Precautions: withdrawal, seizure, and elopement   Based on my evaluation the patient does not appear to have an emergency medical condition.     Floyce Hutching, MD 04/23/2024 2:52 PM

## 2024-04-23 NOTE — ED Notes (Signed)
 Patient refused lunch - states he is not hungry.

## 2024-04-23 NOTE — ED Notes (Signed)
 Patient resting with eyes closed in no apparent acute distress. Respirations even and unlabored. Environment secured. Safety checks in place according to facility policy.

## 2024-04-23 NOTE — ED Notes (Signed)
 Patient approached staff asking about trazodone . Writer informed patient we cannot give any sleep medications on day shift. Patient voiced understanding and stated "time just goes by faster here if you're asleep" and went back to bedroom. Patient in no acute distress. Environment secured, safety checks in place per facility policy.

## 2024-04-23 NOTE — Group Note (Unsigned)
 Group Topic: Communication  Group Date: 04/23/2024 Start Time: 1000 End Time: 1030 Facilitators: Ari Engelbrecht , Lucian Rust, NT  Department: [facility]  Number of Participants: 8  Group Focus: acceptance and check in Treatment Modality:  Cognitive Behavioral Therapy Interventions utilized were exploration Purpose: express feelings and improve communication skills   Name: Louis Martinez Date of Birth: 1986-07-10  MR: [id number]    Level of Participation: {THERAPIES; PSYCH GROUP PARTICIPATION WUJWJ:19147} Quality of Participation: {THERAPIES; PSYCH QUALITY OF PARTICIPATION:23992} Interactions with others: {THERAPIES; PSYCH INTERACTIONS:23993} Mood/Affect: {THERAPIES; PSYCH MOOD/AFFECT:23994} Triggers (if applicable): *** Cognition: {THERAPIES; PSYCH COGNITION:23995} Progress: {THERAPIES; PSYCH PROGRESS:23997} Response: *** Plan: {THERAPIES; PSYCH WGNF:62130}  Patients Problems:  Patient Active Problem List   Diagnosis Date Noted   Cannabis abuse 04/22/2024   Opioid abuse (HCC) 04/22/2024   Substance induced mood disorder (HCC) 04/21/2024   Abnormal glucose 09/27/2019   Elevated BP without diagnosis of hypertension 09/27/2019   Hip flexor tightness 05/26/2018   Hyperlipidemia, mixed    Vitamin D  deficiency    Alcohol dependence (HCC) 09/14/2012

## 2024-04-23 NOTE — ED Notes (Signed)
 Pt is in the bedroom playing cup pong. Respirations even and unlabored.  Environment secured per policy. NAD Will monitor for safety.

## 2024-04-23 NOTE — ED Notes (Addendum)
PT refused breakfast

## 2024-04-24 DIAGNOSIS — F1910 Other psychoactive substance abuse, uncomplicated: Secondary | ICD-10-CM | POA: Diagnosis not present

## 2024-04-24 NOTE — Group Note (Signed)
 Group Topic: Recovery Basics  Group Date: 04/24/2024 Start Time: 1210 End Time: 1320 Facilitators: Arlan Belling, RN  Department: [facility]  Number of Participants: 8  Group Focus: chemical dependency education Treatment Modality:  Behavior Modification Therapy Interventions utilized were patient education Purpose: express feelings, express irrational fears, improve communication skills, increase insight, regain self-worth, reinforce self-care, and relapse prevention strategies  Name: Louis Martinez Date of Birth: 30-Aug-1986  MR: [id number]    Level of Participation: moderate Quality of Participation: attentive and cooperative Interactions with others: gave feedback Mood/Affect: appropriate Triggers (if applicable):   Cognition: coherent/clear and goal directed Progress: Gaining insight Response:   Plan: follow-up needed  Patients Problems:  Patient Active Problem List   Diagnosis Date Noted   Cannabis abuse 04/22/2024   Opioid abuse (HCC) 04/22/2024   Substance induced mood disorder (HCC) 04/21/2024   Abnormal glucose 09/27/2019   Elevated BP without diagnosis of hypertension 09/27/2019   Hip flexor tightness 05/26/2018   Hyperlipidemia, mixed    Vitamin D  deficiency    Alcohol dependence (HCC) 09/14/2012

## 2024-04-24 NOTE — ED Notes (Signed)
 Patient has been awake and alert throughout the day.  He is minimally social and tends to keep to himself.  His overall affect has improved and he is less irritable and less negativistic.  Patient will converse with staff and will make needs known.  Patient and Clinical research associate had an extended 1:1 conversation regarding recovery and etoh use.  He was receptive.  No withdrawal.  Denies avh shi or plan.

## 2024-04-24 NOTE — ED Provider Notes (Signed)
 Behavioral Health Progress Note  Date and Time: 04/24/2024 12:48 PM Name: Louis Martinez MRN:  [id number]  Subjective:  Louis Martinez is a 38 y.o. male admitted to [facility] for alcohol detox and residential rehabilitation placement.    Louis Martinez was seen in the group room on rounds. He denies acute concerns or complaints today.  He reports that he has been speaking with family and that they are supportive of his recovery efforts, although his brother "is the one who sent me here."  He denies somatic concerns or complaints.  He reports sleeping well, but continues to endorse a poor appetite since Monday.  He is still willing to pursue residential treatment and denies problems with his current medication regimen.  He denies SI, HI, and AVH today.  Diagnosis:  Final diagnoses:  Polysubstance abuse (HCC)  Alcohol dependence with withdrawal with complication (HCC)    Total Time spent with patient: 30 minutes    Past Psychiatric History: previously diagnosed with alcohol use disorder, depression and anxiety No inpatient treatment. No current therapist or psychiatrist.  Denies previous suicide attempts.   Has previously been on trazodone , lexapro  and buspar .  Past Medical History: back pain, vitamin D  deficiency, HLD Family History: denies Social History: currently lives with mother and stepfather. Some college. Works as a Financial risk analyst. Single, has 2 children. No firearms.  Sleep: Good  Appetite:  Poor  Current Medications:  Current Facility-Administered Medications  Medication Dose Route Frequency Provider Last Rate Last Admin   acetaminophen  (TYLENOL ) tablet 650 mg  650 mg Oral Q6H PRN Nkwenti, Doris, NP       acetaminophen  (TYLENOL ) tablet 650 mg  650 mg Oral Q6H PRN Nkwenti, Doris, NP       alum & mag hydroxide-simeth (MAALOX/MYLANTA) 200-200-20 MG/5ML suspension 30 mL  30 mL Oral Q4H PRN Nkwenti, Doris, NP       haloperidol  (HALDOL ) tablet 5 mg  5 mg Oral TID PRN Robet Chiquito, NP       And    diphenhydrAMINE  (BENADRYL ) capsule 50 mg  50 mg Oral TID PRN Robet Chiquito, NP       haloperidol  (HALDOL ) tablet 5 mg  5 mg Oral TID PRN Robet Chiquito, NP       And   diphenhydrAMINE  (BENADRYL ) capsule 50 mg  50 mg Oral TID PRN Robet Chiquito, NP       haloperidol  lactate (HALDOL ) injection 10 mg  10 mg Intramuscular TID PRN Robet Chiquito, NP       And   diphenhydrAMINE  (BENADRYL ) injection 50 mg  50 mg Intramuscular TID PRN Robet Chiquito, NP       And   LORazepam  (ATIVAN ) injection 2 mg  2 mg Intramuscular TID PRN Robet Chiquito, NP       haloperidol  lactate (HALDOL ) injection 5 mg  5 mg Intramuscular TID PRN Robet Chiquito, NP       And   diphenhydrAMINE  (BENADRYL ) injection 50 mg  50 mg Intramuscular TID PRN Robet Chiquito, NP       And   LORazepam  (ATIVAN ) injection 2 mg  2 mg Intramuscular TID PRN Robet Chiquito, NP       haloperidol  lactate (HALDOL ) injection 5 mg  5 mg Intramuscular TID PRN Robet Chiquito, NP       And   diphenhydrAMINE  (BENADRYL ) injection 50 mg  50 mg Intramuscular TID PRN Robet Chiquito, NP       And   LORazepam  (ATIVAN ) injection 2 mg  2 mg Intramuscular TID PRN Nkwenti,  Doris, NP       haloperidol  lactate (HALDOL ) injection 10 mg  10 mg Intramuscular TID PRN Robet Chiquito, NP       And   diphenhydrAMINE  (BENADRYL ) injection 50 mg  50 mg Intramuscular TID PRN Robet Chiquito, NP       And   LORazepam  (ATIVAN ) injection 2 mg  2 mg Intramuscular TID PRN Robet Chiquito, NP       escitalopram  (LEXAPRO ) tablet 10 mg  10 mg Oral Daily Nkwenti, Doris, NP   10 mg at 04/24/24 9629   hydrOXYzine  (ATARAX ) tablet 25 mg  25 mg Oral TID PRN Robet Chiquito, NP   25 mg at 04/23/24 2101   magnesium  hydroxide (MILK OF MAGNESIA) suspension 30 mL  30 mL Oral Daily PRN Robet Chiquito, NP       multivitamin with minerals tablet 1 tablet  1 tablet Oral Daily Robet Chiquito, NP   1 tablet at 04/24/24 5284   thiamine  (VITAMIN B1) tablet 100 mg  100 mg Oral Daily Nkwenti, Doris,  NP   100 mg at 04/24/24 1324   traZODone  (DESYREL ) tablet 100 mg  100 mg Oral QHS Robet Chiquito, NP   100 mg at 04/23/24 2100   traZODone  (DESYREL ) tablet 50 mg  50 mg Oral QHS PRN Robet Chiquito, NP       Current Outpatient Medications  Medication Sig Dispense Refill   escitalopram  (LEXAPRO ) 20 MG tablet Take 1 tablet by mouth Daily for Mood (Patient taking differently: Take 20 mg by mouth daily. Take 1 tablet by mouth Daily for Mood) 90 tablet 1   methocarbamol (ROBAXIN) 500 MG tablet Take 500 mg by mouth 3 (three) times daily as needed for muscle spasms.     methylPREDNISolone  (MEDROL  DOSEPAK) 4 MG TBPK tablet Take 4-8 mg by mouth See admin instructions. Take as directed on package for 6 days starting on 04/18/24 (Patient not taking: Reported on 04/21/2024)      Labs  Lab Results:  Admission on 04/21/2024  Component Date Value Ref Range Status   WBC 04/21/2024 9.6  4.0 - 10.5 K/uL Final   RBC 04/21/2024 4.39  4.22 - 5.81 MIL/uL Final   Hemoglobin 04/21/2024 14.8  13.0 - 17.0 g/dL Final   HCT 40/09/2724 41.5  39.0 - 52.0 % Final   MCV 04/21/2024 94.5  80.0 - 100.0 fL Final   MCH 04/21/2024 33.7  26.0 - 34.0 pg Final   MCHC 04/21/2024 35.7  30.0 - 36.0 g/dL Final   RDW 36/64/4034 12.2  11.5 - 15.5 % Final   Platelets 04/21/2024 226  150 - 400 K/uL Final   nRBC 04/21/2024 0.0  0.0 - 0.2 % Final   Performed at [facility], [address], [place], [place] [address]   Color, Urine 04/21/2024 YELLOW  YELLOW Final   APPearance 04/21/2024 HAZY (A)  CLEAR Final   Specific Gravity, Urine 04/21/2024 1.009  1.005 - 1.030 Final   pH 04/21/2024 6.0  5.0 - 8.0 Final   Glucose, UA 04/21/2024 NEGATIVE  NEGATIVE mg/dL Final   Hgb urine dipstick 04/21/2024 NEGATIVE  NEGATIVE Final   Bilirubin Urine 04/21/2024 NEGATIVE  NEGATIVE Final   Ketones, ur 04/21/2024 20 (A)  NEGATIVE mg/dL Final   Protein, ur 56/38/7564 NEGATIVE  NEGATIVE mg/dL Final   Nitrite 33/29/5188 NEGATIVE  NEGATIVE Final    Leukocytes,Ua 04/21/2024 NEGATIVE  NEGATIVE Final   RBC / HPF 04/21/2024 0-5  0 - 5 RBC/hpf Final   WBC,  UA 04/21/2024 0-5  0 - 5 WBC/hpf Final   Bacteria, UA 04/21/2024 RARE (A)  NONE SEEN Final   Squamous Epithelial / HPF 04/21/2024 6-10  0 - 5 /HPF Final   Performed at [facility], [address], [place], [place] [address]   Vitamin B-12 04/21/2024 517  180 - 914 pg/mL Final   Comment: (NOTE) This assay is not validated for testing neonatal or myeloproliferative syndrome specimens for Vitamin B12 levels. Performed at [facility], [address] [place], [place] [address]    Vit D, 25-Hydroxy 04/21/2024 39.05  30 - 100 ng/mL Final   Comment: (NOTE) Vitamin D  deficiency has been defined by the Institute of Medicine  and an Endocrine Society practice guideline as a level of serum 25-OH  vitamin D  less than 20 ng/mL (1,2). The Endocrine Society went on to  further define vitamin D  insufficiency as a level between 21 and 29  ng/mL (2).  1. IOM (Institute of Medicine). 2010. Dietary reference intakes for  calcium and D. [place]: [employer]. 2. Holick MF, Binkley Ong, Bischoff-Ferrari HA, et al. Evaluation,  treatment, and prevention of vitamin D  deficiency: an Endocrine  Society clinical practice guideline, JCEM. 2011 Jul; 96(7): 1911-30.  Performed at [facility], [address], [place], [place] [address]   Admission on 04/20/2024, Discharged on 04/21/2024  Component Date Value Ref Range Status   WBC 04/20/2024 18.2 (H)  4.0 - 10.5 K/uL Final   RBC 04/20/2024 4.76  4.22 - 5.81 MIL/uL Final   Hemoglobin 04/20/2024 16.2  13.0 - 17.0 g/dL Final   HCT 65/78/4696 45.3  39.0 - 52.0 % Final   MCV 04/20/2024 95.2  80.0 - 100.0 fL Final   MCH 04/20/2024 34.0  26.0 - 34.0 pg Final   MCHC 04/20/2024 35.8  30.0 - 36.0 g/dL Final   RDW 29/52/8413 12.1  11.5 - 15.5 % Final   Platelets 04/20/2024 303  150 - 400 K/uL Final   nRBC 04/20/2024 0.0   0.0 - 0.2 % Final   Neutrophils Relative % 04/20/2024 90  % Final   Neutro Abs 04/20/2024 16.5 (H)  1.7 - 7.7 K/uL Final   Lymphocytes Relative 04/20/2024 6  % Final   Lymphs Abs 04/20/2024 1.0  0.7 - 4.0 K/uL Final   Monocytes Relative 04/20/2024 3  % Final   Monocytes Absolute 04/20/2024 0.6  0.1 - 1.0 K/uL Final   Eosinophils Relative 04/20/2024 0  % Final   Eosinophils Absolute 04/20/2024 0.0  0.0 - 0.5 K/uL Final   Basophils Relative 04/20/2024 0  % Final   Basophils Absolute 04/20/2024 0.0  0.0 - 0.1 K/uL Final   Immature Granulocytes 04/20/2024 1  % Final   Abs Immature Granulocytes 04/20/2024 0.14 (H)  0.00 - 0.07 K/uL Final   Performed at [facility], [address], [place], [place] [address]   Sodium 04/20/2024 137  135 - 145 mmol/L Final   Potassium 04/20/2024 3.8  3.5 - 5.1 mmol/L Final   Chloride 04/20/2024 101  98 - 111 mmol/L Final   CO2 04/20/2024 22  22 - 32 mmol/L Final   Glucose, Bld 04/20/2024 101 (H)  70 - 99 mg/dL Final   Glucose reference range applies only to samples taken after fasting for at least 8 hours.   BUN 04/20/2024 8  6 - 20 mg/dL Final   Creatinine, Ser 04/20/2024 0.74  0.61 - 1.24 mg/dL Final  Calcium 04/20/2024 9.5  8.9 - 10.3 mg/dL Final   Total Protein 09/81/1914 7.2  6.5 - 8.1 g/dL Final   Albumin 78/29/5621 4.5  3.5 - 5.0 g/dL Final   AST 30/86/5784 30  15 - 41 U/L Final   ALT 04/20/2024 45 (H)  0 - 44 U/L Final   Alkaline Phosphatase 04/20/2024 72  38 - 126 U/L Final   Total Bilirubin 04/20/2024 1.0  0.0 - 1.2 mg/dL Final   GFR, Estimated 04/20/2024 >60  >60 mL/min Final   Comment: (NOTE) Calculated using the CKD-EPI Creatinine Equation (2021)    Anion gap 04/20/2024 14  5 - 15 Final   Performed at [facility], [address] [place], [place] [address]   Hgb A1c MFr Bld 04/20/2024 5.1  4.8 - 5.6 % Final   Comment: (NOTE)         Prediabetes: 5.7 - 6.4         Diabetes: >6.4         Glycemic control for adults with  diabetes: <7.0    Mean Plasma Glucose 04/20/2024 100  mg/dL Final   Comment: (NOTE) Performed At: [facility] [address] [place], [place] [id number] Pearlean Botts MD WN:[id number]    Alcohol, Ethyl (B) 04/20/2024 48 (H)  <15 mg/dL Final   Comment: Please note change in reference range. (NOTE) For medical purposes only. Performed at [facility], [address], [place], [place] [address]    Cholesterol 04/20/2024 252 (H)  0 - 200 mg/dL Final   Triglycerides 25/95/6387 220 (H)  <150 mg/dL Final   HDL 56/43/3295 60  >40 mg/dL Final   Total CHOL/HDL Ratio 04/20/2024 4.2  RATIO Final   VLDL 04/20/2024 44 (H)  0 - 40 mg/dL Final   LDL Cholesterol 04/20/2024 148 (H)  0 - 99 mg/dL Final   Comment:        Total Cholesterol/HDL:CHD Risk Coronary Heart Disease Risk Table                     Men   Women  1/2 Average Risk   3.4   3.3  Average Risk       5.0   4.4  2 X Average Risk   9.6   7.1  3 X Average Risk  23.4   11.0        Use the calculated Patient Ratio above and the CHD Risk Table to determine the patient's CHD Risk.        ATP III CLASSIFICATION (LDL):  <100     mg/dL   Optimal  188-416  mg/dL   Near or Above                    Optimal  130-159  mg/dL   Borderline  606-301  mg/dL   High  >601     mg/dL   Very High Performed at [facility], [address], [place], [place] [address]    TSH 04/20/2024 1.266  0.350 - 4.500 uIU/mL Final   Comment: Performed by a 3rd Generation assay with a functional sensitivity of <=0.01 uIU/mL. Performed at [facility], [address] [place], [place] [address]    Color, Urine 04/20/2024 STRAW (A)  YELLOW Final   APPearance 04/20/2024 CLEAR  CLEAR Final   Specific Gravity, Urine 04/20/2024 1.003 (L)  1.005 - 1.030 Final   pH 04/20/2024 6.0  5.0 - 8.0  Final   Glucose, UA 04/20/2024 NEGATIVE  NEGATIVE mg/dL Final   Hgb urine dipstick 04/20/2024 NEGATIVE  NEGATIVE Final   Bilirubin Urine 04/20/2024  NEGATIVE  NEGATIVE Final   Ketones, ur 04/20/2024 NEGATIVE  NEGATIVE mg/dL Final   Protein, ur 16/09/9603 NEGATIVE  NEGATIVE mg/dL Final   Nitrite 54/08/8118 NEGATIVE  NEGATIVE Final   Leukocytes,Ua 04/20/2024 TRACE (A)  NEGATIVE Final   RBC / HPF 04/20/2024 0-5  0 - 5 RBC/hpf Final   WBC, UA 04/20/2024 0-5  0 - 5 WBC/hpf Final   Bacteria, UA 04/20/2024 RARE (A)  NONE SEEN Final   Squamous Epithelial / HPF 04/20/2024 0-5  0 - 5 /HPF Final   Performed at [facility], [address] [place], [place] [address]   POC Amphetamine UR 04/20/2024 None Detected  NONE DETECTED (Cut Off Level 1000 ng/mL) Final   POC Secobarbital (BAR) 04/20/2024 None Detected  NONE DETECTED (Cut Off Level 300 ng/mL) Final   POC Buprenorphine (BUP) 04/20/2024 None Detected  NONE DETECTED (Cut Off Level 10 ng/mL) Final   POC Oxazepam (BZO) 04/20/2024 None Detected  NONE DETECTED (Cut Off Level 300 ng/mL) Final   POC Cocaine UR 04/20/2024 None Detected  NONE DETECTED (Cut Off Level 300 ng/mL) Final   POC Methamphetamine UR 04/20/2024 None Detected  NONE DETECTED (Cut Off Level 1000 ng/mL) Final   POC Morphine 04/20/2024 None Detected  NONE DETECTED (Cut Off Level 300 ng/mL) Final   POC Methadone UR 04/20/2024 None Detected  NONE DETECTED (Cut Off Level 300 ng/mL) Final   POC Oxycodone UR 04/20/2024 Positive (A)  NONE DETECTED (Cut Off Level 100 ng/mL) Final   POC Marijuana UR 04/20/2024 Positive (A)  NONE DETECTED (Cut Off Level 50 ng/mL) Final    Blood Alcohol level:  Lab Results  Component Value Date   ETH 48 (H) 04/20/2024    Metabolic Disorder Labs: Lab Results  Component Value Date   HGBA1C 5.1 04/20/2024   MPG 100 04/20/2024   MPG 94 05/07/2023   No results found for: "PROLACTIN" Lab Results  Component Value Date   CHOL 252 (H) 04/20/2024   TRIG 220 (H) 04/20/2024   HDL 60 04/20/2024   CHOLHDL 4.2 04/20/2024   VLDL 44 (H) 04/20/2024   LDLCALC 148 (H) 04/20/2024   LDLCALC 102 (H) 05/07/2023     Therapeutic Lab Levels: No results found for: "LITHIUM" No results found for: "VALPROATE" No results found for: "CBMZ"  Physical Findings   PHQ2-9    Flowsheet Row ED from 04/21/2024 in [facility] Office Visit from 07/07/2018 in [facility]  PHQ-2 Total Score 1 0      Flowsheet Row ED from 04/21/2024 in [facility] ED from 04/20/2024 in [facility] UC from 09/04/2023 in [facility])  C-SSRS RISK CATEGORY No Risk No Risk No Risk        Musculoskeletal  Strength & Muscle Tone: within normal limits Gait & Station: normal Patient leans: N/A  Psychiatric Specialty Exam  Presentation  General Appearance:  Appropriate for Environment; Casual  Eye Contact: Good  Speech: Clear and Coherent; Normal Rate  Speech Volume: Normal  Handedness: Right   Mood and Affect  Mood: Euthymic  Affect: Appropriate; Congruent   Thought Process  Thought Processes: Coherent; Linear  Descriptions of Associations:Intact  Orientation:Full (Time, Place and Person)  Thought Content:Logical; WDL  Diagnosis of Schizophrenia  or Schizoaffective disorder in past: No    Hallucinations:Hallucinations: None  Ideas of Reference:None  Suicidal Thoughts:Suicidal Thoughts: No  Homicidal Thoughts:Homicidal Thoughts: No   Sensorium  Memory: Recent Good; Immediate Good  Judgment: Fair  Insight: Fair   Art therapist  Concentration: Good  Attention Span: Good  Recall: Good  Fund of Knowledge: Good  Language: Good   Psychomotor Activity  Psychomotor Activity:Psychomotor Activity: Normal   Assets  Assets: Communication Skills; Desire for Improvement; Social Support; Resilience   Sleep  Sleep:Sleep: Good   No data recorded  Physical Exam  Physical Exam Vitals and nursing note  reviewed.  Constitutional:      Appearance: Normal appearance.  HENT:     Head: Normocephalic.  Eyes:     Extraocular Movements: Extraocular movements intact.  Pulmonary:     Effort: Pulmonary effort is normal.  Musculoskeletal:        General: Normal range of motion.     Cervical back: Normal range of motion.  Neurological:     General: No focal deficit present.     Mental Status: He is alert and oriented to person, place, and time.  Psychiatric:        Behavior: Behavior normal.    Review of Systems  Constitutional:  Negative for chills and fever.  Gastrointestinal:  Negative for constipation, diarrhea, nausea and vomiting.  Genitourinary:  Negative for dysuria.  Musculoskeletal:  Negative for myalgias.  Neurological:  Negative for tremors.   Blood pressure 121/82, pulse 97, temperature 98.1 F (36.7 C), temperature source Oral, resp. rate 18, SpO2 99%. There is no height or weight on file to calculate BMI.  Treatment Plan Summary: Jentzen Minasyan, admitted to [facility] involuntarily, has some intrinsic motivation for residential substance use treatment despite the fact that he does continue to mostly externalize, blaming his brother for this admission.  Patient poses no safety concerns toward himself nor others.  LCSW assisting with disposition planning.   Long Term Goals: Improvement in symptoms so as ready for discharge   Short Term Goals: Patient will verbalize feelings in meetings with treatment team members., Patient will attend at least of 50% of the groups daily., Pt will complete the PHQ9 on admission, day 3 and discharge., and Patient will take medications as prescribed daily.   Medications: Mood/anxiety: continue group therapy, milieu therapy, 1:1 evaluation with provider.  Medication management: trazodone  100mg  PO at bedtime for sleep. Lexapro  10mg  PO daily for mood and anxiety.   Substance Abuse:  brief intervention provided abstinence advised.  opioids: COWS  monitoring with coverage of withdrawal symptoms.  Severe alcohol use disorder: replacing thiamine . Encourage adequate PO intake. May require lab monitoring of electrolytes. Benzodiazepine taper as tolerated. Seizure precautions. Monitor for delirium tremens.  CIWA 0 for greater than 48 hours.  Will discontinue at this time. Patient completed Ativan  taper today Cannabis use disorder: Encouraging insight. Education provided on the effect of cannabis on mental health. Coverage for withdrawal symptoms and nausea.  Nicotine and tobacco use: N/A Medical: PRNs for pain, constipation, indigestion available.  Labs/studies: none new Safety and Monitoring: involuntarily admission to [facility]/Facility based care unit [facility]) unit for safety, stabilization and treatment Daily contact with patient to assess and evaluate symptoms and progress in treatment Patient's case to be discussed in multi-disciplinary team meeting Observation Level : q15 minute checks Vital signs: q12 hours Precautions: withdrawal, seizure, and elopement   Based on my evaluation the patient does not appear to have an emergency medical condition.  Dispo:  Pending    Shery Done, MD 04/24/2024 12:48 PM

## 2024-04-24 NOTE — ED Notes (Signed)
 Patient sleeping with eyes closed. NAD. Will continue to monitor for safety.

## 2024-04-24 NOTE — Group Note (Signed)
 Group Topic: Relapse and Recovery  Group Date: 04/24/2024 Start Time: 2005 End Time: 2055 Facilitators: Soila Dunnings  Department: [facility]  Number of Participants: 7  Group Focus: acceptance, relapse prevention, self-awareness, and substance abuse education Treatment Modality:  Leisure Development Interventions utilized were leisure development, reminiscence, story telling, and support Purpose: enhance coping skills, express feelings, relapse prevention strategies, and trigger / craving management  Name: Louis Martinez Date of Birth: 1985-12-19  MR: [id number]    Level of Participation: active Quality of Participation: attentive and cooperative Interactions with others: gave feedback Mood/Affect: appropriate and positive Triggers (if applicable): n/a Cognition: coherent/clear Progress: Gaining insight Response: pt was active and engaged in storytelling and reminiscence during group  Plan: patient will be encouraged to continue attending groups   Patients Problems:  Patient Active Problem List   Diagnosis Date Noted   Cannabis abuse 04/22/2024   Opioid abuse (HCC) 04/22/2024   Substance induced mood disorder (HCC) 04/21/2024   Abnormal glucose 09/27/2019   Elevated BP without diagnosis of hypertension 09/27/2019   Hip flexor tightness 05/26/2018   Hyperlipidemia, mixed    Vitamin D  deficiency    Alcohol dependence (HCC) 09/14/2012

## 2024-04-24 NOTE — Group Note (Signed)
 Group Topic: Communication  Group Date: 04/24/2024 Start Time: 1000 End Time: 1045 Facilitators: Milan Alfred, NT MHT 2 Department: [facility]  Number of Participants: 2  Group Focus: communication Treatment Modality:  Cognitive Behavioral Therapy Interventions utilized were confrontation Purpose: improve communication skills  Name: Daryl Beehler Date of Birth: May 27, 1986  MR: [id number]    Level of Participation: Patient did not attend group Quality of Participation: N/A Interactions with others: N/A Mood/Affect: N/A Triggers (if applicable): N/A Cognition: N/A Progress: N/A Response: N/A Plan: N/A  Patients Problems:  Patient Active Problem List   Diagnosis Date Noted   Cannabis abuse 04/22/2024   Opioid abuse (HCC) 04/22/2024   Substance induced mood disorder (HCC) 04/21/2024   Abnormal glucose 09/27/2019   Elevated BP without diagnosis of hypertension 09/27/2019   Hip flexor tightness 05/26/2018   Hyperlipidemia, mixed    Vitamin D  deficiency    Alcohol dependence (HCC) 09/14/2012

## 2024-04-25 DIAGNOSIS — F1910 Other psychoactive substance abuse, uncomplicated: Secondary | ICD-10-CM | POA: Diagnosis not present

## 2024-04-25 DIAGNOSIS — F10239 Alcohol dependence with withdrawal, unspecified: Secondary | ICD-10-CM

## 2024-04-25 NOTE — ED Notes (Signed)
 Patient in hallway on phone, calm and composed. No acute distress noted. No concerns voiced. Informed patient to notify staff with any needs or assistance. Patient verbalized understanding or agreement. Safety checks in place per facility policy.

## 2024-04-25 NOTE — ED Notes (Signed)
 Pt is in the dayroom watching TV with peers. Pt denies SI/HI/AVH. Pt has no further complain.No acute distress noted. Will continue to monitor for safety and provide support.

## 2024-04-25 NOTE — ED Notes (Signed)
 Patient alert & oriented x4. Denies intent to harm self or others when asked. Denies A/VH. Patient denies any physical complaints when asked. No acute distress noted. Scheduled medications administered with no complications. Support and encouragement provided. Routine safety checks conducted per facility protocol. Encouraged patient to notify staff if any thoughts of harm towards self or others arise. Patient verbalizes understanding and agreement.

## 2024-04-25 NOTE — Group Note (Signed)
 Group Topic: Social Support  Group Date: 04/25/2024 Start Time: 1400 End Time: 1500 Facilitators: Esther Hem, NT  Department: [facility]  Number of Participants: 8  Group Focus: goals/reality orientation Treatment Modality:  Psychoeducation Interventions utilized were support Purpose: regain self-worth and relapse prevention strategies  Name: Louis Martinez Date of Birth: 07-07-1986  MR: [id number]    Level of Participation: active Quality of Participation: cooperative Interactions with others: gave feedback Mood/Affect: appropriate Triggers (if applicable): n/a Cognition: coherent/clear Progress: Gaining insight Response: PT stated that he missed his family and wanted to be better Plan: patient will be encouraged to attend groups  Patients Problems:  Patient Active Problem List   Diagnosis Date Noted   Cannabis abuse 04/22/2024   Opioid abuse (HCC) 04/22/2024   Substance induced mood disorder (HCC) 04/21/2024   Abnormal glucose 09/27/2019   Elevated BP without diagnosis of hypertension 09/27/2019   Hip flexor tightness 05/26/2018   Hyperlipidemia, mixed    Vitamin D  deficiency    Alcohol dependence (HCC) 09/14/2012

## 2024-04-25 NOTE — Group Note (Signed)
 Group Topic: Social Support  Group Date: 04/25/2024 Start Time: 1845 End Time: 1853 Facilitators: Little Bashore, Arbutus Knoll, RN  Department: [facility]  Number of Participants: 1  Group Focus: check in Treatment Modality:  Individual Therapy Interventions utilized were support Purpose: express feelings  Name: Louis Martinez Date of Birth: Apr 11, 1986  MR: [id number]    Level of Participation: active Quality of Participation: attentive and cooperative Interactions with others: gave feedback Mood/Affect: appropriate and brightens with interaction Triggers (if applicable): None identified at this time Cognition: coherent/clear and logical Progress: Gaining insight Response: Patient voices no specific complaints at this time. Patient has been in milieu throughout the day, interacting well with peers and staff. Plan: patient will be encouraged to continue to attend programing on the unit  Patients Problems:  Patient Active Problem List   Diagnosis Date Noted   Cannabis abuse 04/22/2024   Opioid abuse (HCC) 04/22/2024   Substance induced mood disorder (HCC) 04/21/2024   Abnormal glucose 09/27/2019   Elevated BP without diagnosis of hypertension 09/27/2019   Hip flexor tightness 05/26/2018   Hyperlipidemia, mixed    Vitamin D  deficiency    Alcohol dependence (HCC) 09/14/2012

## 2024-04-25 NOTE — ED Notes (Signed)
 Patient is sleeping. Respirations equal and unlabored, skin warm and dry. No change in assessment or acuity. Routine safety checks conducted according to facility protocol. Will continue to monitor for safety.

## 2024-04-25 NOTE — ED Notes (Signed)
 Patient sitting in dayroom, calm and composed. No acute distress noted. No concerns voiced. Informed patient to notify staff with any needs or assistance. Patient verbalized understanding or agreement. Safety checks in place per facility policy.

## 2024-04-25 NOTE — ED Provider Notes (Signed)
 Behavioral Health Progress Note  Date and Time: 04/25/2024 12:51 PM Name: Louis Martinez MRN:  [id number]  Subjective:  Louis Martinez was seen today in the common area. He feels that he is doing "good" overall. He has not been eating much lately because he was fasting for health purposes. He is keeping up with hydration. He is not sleeping well and attributes this to the uncomfortable beds. He denies any calls to mom but has been talking to his sister-in-law. He also restates that he feels that "he's [brother] is behind it" and does not feel that the IVC and 50B are accurate, and may be exaggerated at his brother's insistence. He is uncertain about his discharge plan, but when asked about residential rehab he agrees that this is the tentative plan. He denies medication side effects, hallucinations or thoughts of harm to self or others.   Diagnosis:  Final diagnoses:  Polysubstance abuse (HCC)  Alcohol dependence with withdrawal with complication (HCC)    Total Time spent with patient: 15 minutes  Past Psychiatric History: previously diagnosed with alcohol use disorder, depression and anxiety No inpatient treatment. No current therapist or psychiatrist.  Denies previous suicide attempts.   Has previously been on trazodone , lexapro  and buspar .  Past Medical History: back pain, vitamin D  deficiency, HLD Family History: denies Social History: currently lives with mother and stepfather. Some college. Works as a Financial risk analyst. Single, has 2 children. No firearms.  Sleep: Poor  Appetite:  Poor  Current Medications:  Current Facility-Administered Medications  Medication Dose Route Frequency Provider Last Rate Last Admin   acetaminophen  (TYLENOL ) tablet 650 mg  650 mg Oral Q6H PRN Nkwenti, Doris, NP       acetaminophen  (TYLENOL ) tablet 650 mg  650 mg Oral Q6H PRN Nkwenti, Doris, NP       alum & mag hydroxide-simeth (MAALOX/MYLANTA) 200-200-20 MG/5ML suspension 30 mL  30 mL Oral Q4H PRN Nkwenti, Doris, NP        haloperidol  (HALDOL ) tablet 5 mg  5 mg Oral TID PRN Robet Chiquito, NP       And   diphenhydrAMINE  (BENADRYL ) capsule 50 mg  50 mg Oral TID PRN Robet Chiquito, NP       haloperidol  (HALDOL ) tablet 5 mg  5 mg Oral TID PRN Robet Chiquito, NP       And   diphenhydrAMINE  (BENADRYL ) capsule 50 mg  50 mg Oral TID PRN Robet Chiquito, NP       haloperidol  lactate (HALDOL ) injection 10 mg  10 mg Intramuscular TID PRN Robet Chiquito, NP       And   diphenhydrAMINE  (BENADRYL ) injection 50 mg  50 mg Intramuscular TID PRN Robet Chiquito, NP       And   LORazepam  (ATIVAN ) injection 2 mg  2 mg Intramuscular TID PRN Robet Chiquito, NP       haloperidol  lactate (HALDOL ) injection 5 mg  5 mg Intramuscular TID PRN Robet Chiquito, NP       And   diphenhydrAMINE  (BENADRYL ) injection 50 mg  50 mg Intramuscular TID PRN Robet Chiquito, NP       And   LORazepam  (ATIVAN ) injection 2 mg  2 mg Intramuscular TID PRN Robet Chiquito, NP       haloperidol  lactate (HALDOL ) injection 5 mg  5 mg Intramuscular TID PRN Robet Chiquito, NP       And   diphenhydrAMINE  (BENADRYL ) injection 50 mg  50 mg Intramuscular TID PRN Robet Chiquito, NP  And   LORazepam  (ATIVAN ) injection 2 mg  2 mg Intramuscular TID PRN Robet Chiquito, NP       haloperidol  lactate (HALDOL ) injection 10 mg  10 mg Intramuscular TID PRN Robet Chiquito, NP       And   diphenhydrAMINE  (BENADRYL ) injection 50 mg  50 mg Intramuscular TID PRN Robet Chiquito, NP       And   LORazepam  (ATIVAN ) injection 2 mg  2 mg Intramuscular TID PRN Robet Chiquito, NP       escitalopram  (LEXAPRO ) tablet 10 mg  10 mg Oral Daily Nkwenti, Doris, NP   10 mg at 04/25/24 8119   hydrOXYzine  (ATARAX ) tablet 25 mg  25 mg Oral TID PRN Robet Chiquito, NP   25 mg at 04/23/24 2101   magnesium  hydroxide (MILK OF MAGNESIA) suspension 30 mL  30 mL Oral Daily PRN Robet Chiquito, NP       multivitamin with minerals tablet 1 tablet  1 tablet Oral Daily Robet Chiquito, NP   1 tablet at  04/25/24 1478   thiamine  (VITAMIN B1) tablet 100 mg  100 mg Oral Daily Robet Chiquito, NP   100 mg at 04/25/24 2956   traZODone  (DESYREL ) tablet 100 mg  100 mg Oral QHS Robet Chiquito, NP   100 mg at 04/24/24 2113   traZODone  (DESYREL ) tablet 50 mg  50 mg Oral QHS PRN Robet Chiquito, NP       Current Outpatient Medications  Medication Sig Dispense Refill   escitalopram  (LEXAPRO ) 20 MG tablet Take 1 tablet by mouth Daily for Mood (Patient taking differently: Take 20 mg by mouth daily. Take 1 tablet by mouth Daily for Mood) 90 tablet 1   methocarbamol (ROBAXIN) 500 MG tablet Take 500 mg by mouth 3 (three) times daily as needed for muscle spasms.      Labs  Lab Results:  Admission on 04/21/2024  Component Date Value Ref Range Status   WBC 04/21/2024 9.6  4.0 - 10.5 K/uL Final   RBC 04/21/2024 4.39  4.22 - 5.81 MIL/uL Final   Hemoglobin 04/21/2024 14.8  13.0 - 17.0 g/dL Final   HCT 21/30/8657 41.5  39.0 - 52.0 % Final   MCV 04/21/2024 94.5  80.0 - 100.0 fL Final   MCH 04/21/2024 33.7  26.0 - 34.0 pg Final   MCHC 04/21/2024 35.7  30.0 - 36.0 g/dL Final   RDW 84/69/6295 12.2  11.5 - 15.5 % Final   Platelets 04/21/2024 226  150 - 400 K/uL Final   nRBC 04/21/2024 0.0  0.0 - 0.2 % Final   Performed at [facility], [address] [place], [place] [address]   Color, Urine 04/21/2024 YELLOW  YELLOW Final   APPearance 04/21/2024 HAZY (A)  CLEAR Final   Specific Gravity, Urine 04/21/2024 1.009  1.005 - 1.030 Final   pH 04/21/2024 6.0  5.0 - 8.0 Final   Glucose, UA 04/21/2024 NEGATIVE  NEGATIVE mg/dL Final   Hgb urine dipstick 04/21/2024 NEGATIVE  NEGATIVE Final   Bilirubin Urine 04/21/2024 NEGATIVE  NEGATIVE Final   Ketones, ur 04/21/2024 20 (A)  NEGATIVE mg/dL Final   Protein, ur 24/40/1027 NEGATIVE  NEGATIVE mg/dL Final   Nitrite 25/36/6440 NEGATIVE  NEGATIVE Final   Leukocytes,Ua 04/21/2024 NEGATIVE  NEGATIVE Final   RBC / HPF 04/21/2024 0-5  0 - 5 RBC/hpf Final   WBC, UA  04/21/2024 0-5  0 - 5 WBC/hpf Final   Bacteria, UA 04/21/2024 RARE (A)  NONE SEEN Final  Squamous Epithelial / HPF 04/21/2024 6-10  0 - 5 /HPF Final   Performed at [facility], [address], [place], [place] [address]   Vitamin B-12 04/21/2024 517  180 - 914 pg/mL Final   Comment: (NOTE) This assay is not validated for testing neonatal or myeloproliferative syndrome specimens for Vitamin B12 levels. Performed at [facility], [address] [place], [place] [address]    Vit D, 25-Hydroxy 04/21/2024 39.05  30 - 100 ng/mL Final   Comment: (NOTE) Vitamin D  deficiency has been defined by the Institute of Medicine  and an Endocrine Society practice guideline as a level of serum 25-OH  vitamin D  less than 20 ng/mL (1,2). The Endocrine Society went on to  further define vitamin D  insufficiency as a level between 21 and 29  ng/mL (2).  1. IOM (Institute of Medicine). 2010. Dietary reference intakes for  calcium and D. [place]: [employer]. 2. Holick MF, Binkley [place], Bischoff-Ferrari HA, et al. Evaluation,  treatment, and prevention of vitamin D  deficiency: an Endocrine  Society clinical practice guideline, JCEM. 2011 Jul; 96(7): 1911-30.  Performed at [facility], [address] [place], [place] [address]   Admission on 04/20/2024, Discharged on 04/21/2024  Component Date Value Ref Range Status   WBC 04/20/2024 18.2 (H)  4.0 - 10.5 K/uL Final   RBC 04/20/2024 4.76  4.22 - 5.81 MIL/uL Final   Hemoglobin 04/20/2024 16.2  13.0 - 17.0 g/dL Final   HCT 91/47/8295 45.3  39.0 - 52.0 % Final   MCV 04/20/2024 95.2  80.0 - 100.0 fL Final   MCH 04/20/2024 34.0  26.0 - 34.0 pg Final   MCHC 04/20/2024 35.8  30.0 - 36.0 g/dL Final   RDW 62/13/0865 12.1  11.5 - 15.5 % Final   Platelets 04/20/2024 303  150 - 400 K/uL Final   nRBC 04/20/2024 0.0  0.0 - 0.2 % Final   Neutrophils Relative % 04/20/2024 90  % Final   Neutro Abs 04/20/2024 16.5 (H)  1.7 - 7.7  K/uL Final   Lymphocytes Relative 04/20/2024 6  % Final   Lymphs Abs 04/20/2024 1.0  0.7 - 4.0 K/uL Final   Monocytes Relative 04/20/2024 3  % Final   Monocytes Absolute 04/20/2024 0.6  0.1 - 1.0 K/uL Final   Eosinophils Relative 04/20/2024 0  % Final   Eosinophils Absolute 04/20/2024 0.0  0.0 - 0.5 K/uL Final   Basophils Relative 04/20/2024 0  % Final   Basophils Absolute 04/20/2024 0.0  0.0 - 0.1 K/uL Final   Immature Granulocytes 04/20/2024 1  % Final   Abs Immature Granulocytes 04/20/2024 0.14 (H)  0.00 - 0.07 K/uL Final   Performed at [facility], [address] [place], [place] [address]   Sodium 04/20/2024 137  135 - 145 mmol/L Final   Potassium 04/20/2024 3.8  3.5 - 5.1 mmol/L Final   Chloride 04/20/2024 101  98 - 111 mmol/L Final   CO2 04/20/2024 22  22 - 32 mmol/L Final   Glucose, Bld 04/20/2024 101 (H)  70 - 99 mg/dL Final   Glucose reference range applies only to samples taken after fasting for at least 8 hours.   BUN 04/20/2024 8  6 - 20 mg/dL Final   Creatinine, Ser 04/20/2024 0.74  0.61 - 1.24 mg/dL Final   Calcium 62/95/2841 9.5  8.9 - 10.3 mg/dL Final   Total Protein 32/44/0102 7.2  6.5 - 8.1 g/dL  Final   Albumin 04/20/2024 4.5  3.5 - 5.0 g/dL Final   AST 16/09/9603 30  15 - 41 U/L Final   ALT 04/20/2024 45 (H)  0 - 44 U/L Final   Alkaline Phosphatase 04/20/2024 72  38 - 126 U/L Final   Total Bilirubin 04/20/2024 1.0  0.0 - 1.2 mg/dL Final   GFR, Estimated 04/20/2024 >60  >60 mL/min Final   Comment: (NOTE) Calculated using the CKD-EPI Creatinine Equation (2021)    Anion gap 04/20/2024 14  5 - 15 Final   Performed at [facility], [address] [place], [place] [address]   Hgb A1c MFr Bld 04/20/2024 5.1  4.8 - 5.6 % Final   Comment: (NOTE)         Prediabetes: 5.7 - 6.4         Diabetes: >6.4         Glycemic control for adults with diabetes: <7.0    Mean Plasma Glucose 04/20/2024 100  mg/dL Final   Comment: (NOTE) Performed At: [facility] [address] [place], [place] [id number] Pearlean Botts MD FA:[id number]    Alcohol, Ethyl (B) 04/20/2024 48 (H)  <15 mg/dL Final   Comment: Please note change in reference range. (NOTE) For medical purposes only. Performed at [facility], [address], [place], [place] [address]    Cholesterol 04/20/2024 252 (H)  0 - 200 mg/dL Final   Triglycerides 52/84/1324 220 (H)  <150 mg/dL Final   HDL 40/09/2724 60  >40 mg/dL Final   Total CHOL/HDL Ratio 04/20/2024 4.2  RATIO Final   VLDL 04/20/2024 44 (H)  0 - 40 mg/dL Final   LDL Cholesterol 04/20/2024 148 (H)  0 - 99 mg/dL Final   Comment:        Total Cholesterol/HDL:CHD Risk Coronary Heart Disease Risk Table                     Men   Women  1/2 Average Risk   3.4   3.3  Average Risk       5.0   4.4  2 X Average Risk   9.6   7.1  3 X Average Risk  23.4   11.0        Use the calculated Patient Ratio above and the CHD Risk Table to determine the patient's CHD Risk.        ATP III CLASSIFICATION (LDL):  <100     mg/dL   Optimal  366-440  mg/dL   Near or Above                    Optimal  130-159  mg/dL   Borderline  347-425  mg/dL   High  >956     mg/dL   Very High Performed at [facility], [address], [place], [place] [address]    TSH 04/20/2024 1.266  0.350 - 4.500 uIU/mL Final   Comment: Performed by a 3rd Generation assay with a functional sensitivity of <=0.01 uIU/mL. Performed at [facility], [address] [place], [place] [address]    Color, Urine 04/20/2024 STRAW (A)  YELLOW Final   APPearance 04/20/2024 CLEAR  CLEAR Final   Specific Gravity, Urine 04/20/2024 1.003 (L)  1.005 - 1.030 Final   pH 04/20/2024 6.0  5.0 - 8.0 Final   Glucose, UA 04/20/2024 NEGATIVE  NEGATIVE mg/dL Final   Hgb urine dipstick 04/20/2024 NEGATIVE  NEGATIVE  Final   Bilirubin Urine 04/20/2024 NEGATIVE  NEGATIVE Final   Ketones, ur 04/20/2024 NEGATIVE  NEGATIVE mg/dL Final   Protein, ur 09/81/1914 NEGATIVE   NEGATIVE mg/dL Final   Nitrite 78/29/5621 NEGATIVE  NEGATIVE Final   Leukocytes,Ua 04/20/2024 TRACE (A)  NEGATIVE Final   RBC / HPF 04/20/2024 0-5  0 - 5 RBC/hpf Final   WBC, UA 04/20/2024 0-5  0 - 5 WBC/hpf Final   Bacteria, UA 04/20/2024 RARE (A)  NONE SEEN Final   Squamous Epithelial / HPF 04/20/2024 0-5  0 - 5 /HPF Final   Performed at [facility], [address], [place], [place] [address]   POC Amphetamine UR 04/20/2024 None Detected  NONE DETECTED (Cut Off Level 1000 ng/mL) Final   POC Secobarbital (BAR) 04/20/2024 None Detected  NONE DETECTED (Cut Off Level 300 ng/mL) Final   POC Buprenorphine (BUP) 04/20/2024 None Detected  NONE DETECTED (Cut Off Level 10 ng/mL) Final   POC Oxazepam (BZO) 04/20/2024 None Detected  NONE DETECTED (Cut Off Level 300 ng/mL) Final   POC Cocaine UR 04/20/2024 None Detected  NONE DETECTED (Cut Off Level 300 ng/mL) Final   POC Methamphetamine UR 04/20/2024 None Detected  NONE DETECTED (Cut Off Level 1000 ng/mL) Final   POC Morphine 04/20/2024 None Detected  NONE DETECTED (Cut Off Level 300 ng/mL) Final   POC Methadone UR 04/20/2024 None Detected  NONE DETECTED (Cut Off Level 300 ng/mL) Final   POC Oxycodone UR 04/20/2024 Positive (A)  NONE DETECTED (Cut Off Level 100 ng/mL) Final   POC Marijuana UR 04/20/2024 Positive (A)  NONE DETECTED (Cut Off Level 50 ng/mL) Final    Blood Alcohol level:  Lab Results  Component Value Date   ETH 48 (H) 04/20/2024    Metabolic Disorder Labs: Lab Results  Component Value Date   HGBA1C 5.1 04/20/2024   MPG 100 04/20/2024   MPG 94 05/07/2023   No results found for: "PROLACTIN" Lab Results  Component Value Date   CHOL 252 (H) 04/20/2024   TRIG 220 (H) 04/20/2024   HDL 60 04/20/2024   CHOLHDL 4.2 04/20/2024   VLDL 44 (H) 04/20/2024   LDLCALC 148 (H) 04/20/2024   LDLCALC 102 (H) 05/07/2023    Therapeutic Lab Levels: No results found for: "LITHIUM" No results found for: "VALPROATE" No results found  for: "CBMZ"  Physical Findings   PHQ2-9    Flowsheet Row ED from 04/21/2024 in [facility] Office Visit from 07/07/2018 in [facility]  PHQ-2 Total Score 1 0      Flowsheet Row ED from 04/21/2024 in [facility] ED from 04/20/2024 in [facility] UC from 09/04/2023 in [facility])  C-SSRS RISK CATEGORY No Risk No Risk No Risk        Musculoskeletal  Strength & Muscle Tone: within normal limits Gait & Station: normal Patient leans: N/A  Psychiatric Specialty Exam  Presentation  General Appearance:  Appropriate for Environment; Casual  Eye Contact: Good  Speech: Normal Rate  Speech Volume: Normal  Handedness: Right   Mood and Affect  Mood: Anxious  Affect: Congruent   Thought Process  Thought Processes: Linear  Descriptions of Associations:Intact  Orientation:Full (Time, Place and Person)  Thought Content:Logical  Diagnosis of Schizophrenia or Schizoaffective disorder in past: No    Hallucinations:Hallucinations: None  Ideas of Reference:None  Suicidal Thoughts:Suicidal Thoughts: No  Homicidal Thoughts:Homicidal Thoughts: No  Sensorium  Memory: Immediate Good; Recent Fair; Remote Fair  Judgment: Fair  Insight: Fair   Executive Functions  Concentration: Good  Attention Span: Good  Recall: Fair  Fund of Knowledge: Good  Language: Good   Psychomotor Activity  Psychomotor Activity: Psychomotor Activity: Normal   Assets  Assets: Communication Skills; Social Support   Sleep  Sleep: Sleep: Good   No data recorded  Physical Exam  Physical Exam ROS Blood pressure 119/80, pulse 64, temperature 98.1 F (36.7 C), temperature source Oral, resp. rate 16, SpO2 99%. There is no height or weight on file to calculate BMI.  Treatment Plan Summary: Eshawn Coor, admitted to [facility] involuntarily, has some intrinsic motivation for residential substance use treatment despite the fact that he does continue to mostly externalize, blaming his brother for this admission.  Patient poses no safety concerns toward himself nor others.  LCSW assisting with disposition planning.     Long Term Goals: Improvement in symptoms so as ready for discharge   Short Term Goals: Patient will verbalize feelings in meetings with treatment team members., Patient will attend at least of 50% of the groups daily., Pt will complete the PHQ9 on admission, day 3 and discharge., and Patient will take medications as prescribed daily.   Medications: Mood/anxiety: continue group therapy, milieu therapy, 1:1 evaluation with provider.  Medication management: trazodone  100mg  PO at bedtime for sleep. Lexapro  10mg  PO daily for mood and anxiety.   Substance Abuse:  brief intervention provided abstinence advised.  opioids: COWS monitoring with coverage of withdrawal symptoms.  Severe alcohol use disorder: replacing thiamine . Encourage adequate PO intake. May require lab monitoring of electrolytes. Benzodiazepine taper as tolerated. Seizure precautions. Monitor for delirium tremens.  CIWA 0 for greater than 48 hours.  Will discontinue at this time. Patient completed Ativan  taper today Cannabis use disorder: Encouraging insight. Education provided on the effect of cannabis on mental health. Coverage for withdrawal symptoms and nausea.  Nicotine and tobacco use: N/A Medical: PRNs for pain, constipation, indigestion available.  Labs/studies: none new Safety and Monitoring: involuntarily admission to [facility]/Facility based care unit [facility]) unit for safety, stabilization and treatment Daily contact with patient to assess and evaluate symptoms and progress in treatment Patient's case to be discussed in multi-disciplinary team meeting Observation Level : q15 minute checks Vital signs: q12  hours Precautions: withdrawal, seizure, and elopement   Based on my evaluation the patient does not appear to have an emergency medical condition.   Dispo: Pending placement in residential rehab  Floyce Hutching, MD 04/25/2024 12:51 PM

## 2024-04-25 NOTE — ED Notes (Signed)
Patient is sleeping. Respirations equal and unlabored, skin warm and dry, NAD. No change in assessment or acuity. Routine safety checks conducted according to facility protocol. Will continue to monitor for safety.   

## 2024-04-26 DIAGNOSIS — F10239 Alcohol dependence with withdrawal, unspecified: Secondary | ICD-10-CM | POA: Diagnosis not present

## 2024-04-26 DIAGNOSIS — F1910 Other psychoactive substance abuse, uncomplicated: Secondary | ICD-10-CM | POA: Diagnosis not present

## 2024-04-26 MED ORDER — TRAZODONE HCL 100 MG PO TABS: 100.0000 mg | ORAL_TABLET | Freq: Every day | ORAL | 0 refills | Status: AC

## 2024-04-26 MED ORDER — HYDROXYZINE HCL 25 MG PO TABS: 25.0000 mg | ORAL_TABLET | Freq: Three times a day (TID) | ORAL | 0 refills | Status: AC | PRN

## 2024-04-26 MED ORDER — VITAMIN B-1 100 MG PO TABS: 100.0000 mg | ORAL_TABLET | Freq: Every day | ORAL | Status: AC

## 2024-04-26 MED ORDER — ADULT MULTIVITAMIN W/MINERALS CH: 1.0000 | ORAL_TABLET | Freq: Every day | ORAL | Status: AC

## 2024-04-26 MED ORDER — ESCITALOPRAM OXALATE 10 MG PO TABS: 10.0000 mg | ORAL_TABLET | Freq: Every day | ORAL | 0 refills | Status: AC

## 2024-04-26 NOTE — ED Notes (Signed)
 Pt observed sitting in dayroom. Calm at present.  Observed interacting appropriately with staff and peers. Denied current SI plan and intent.  No observations of RIS. Q 15 minute observations for safety continue

## 2024-04-26 NOTE — ED Notes (Signed)
 Patient is sleeping. Respirations equal and unlabored, skin warm and dry. No change in assessment or acuity. Routine safety checks conducted according to facility protocol. Will continue to monitor for safety.

## 2024-04-26 NOTE — Discharge Planning (Signed)
 Sw spoke with patient and he stated that he no longer wanted to pursue residential treatment and is aware of approval at [facility]. He stated he wanted to see his children and would prefer just to be discharged and will follow up with outpatient services upstairs with [facility]. SW will make provider aware of changes and have notified [place] at [facility] to disregard but will provide patient with their information should he change his mind in the future.

## 2024-04-26 NOTE — ED Provider Notes (Signed)
 FBC/OBS ASAP Discharge Summary  Date and Time: 04/26/2024 12:59 PM  Name: Louis Martinez  MRN:  [id number]   Discharge Diagnoses:  Final diagnoses:  Polysubstance abuse (HCC)  Alcohol dependence with withdrawal with complication (HCC)   History of Present illness:Per initial assessment: "Louis Martinez is a 38 year old male who presented to the [facility]) under involuntary commitment (IVC) via the Sheriff's Department. Per the IVC documentation, [name] has reportedly posed a daily danger to himself and others by making frequent threats to kill or harm himself and expressing homicidal ideation. He has also exhibited rageful outbursts during which his eyes bulge and his facial expressions and movements become uncontrollable. His most recent outburst reportedly involved destroying furniture, including a dresser and chair, and making further threats to harm himself and others."   Stay Summary: Patient spent an night stay in the observation area of the [facility], prior to being accepted to the facility based crisis area of same facility the following day.  Over the course of this stay, patient was restarted on his home medications including Lexapro  10 mg daily for management of depressive symptoms, trazodone  100 mg nightly for management of sleep, and was also started on an Ativan  taper for detoxification from alcohol.  Patient successfully completed the taper, and was accepted into a 30-day rehab facility [facility]), and was in the process of being transferred to that facility on 04/27/2024.  Patient has Clinical research associate today, denies SI/HI/AVH, denies delusional thinking, presenting with perseverations about his "middle brother" talking about him, but is not able to provide Clinical research associate with tangible reason as to why he feels so. This however is mild, as he does not focus on this.  Patient reports that he is eating well, sleeping well, reports that he has court  on Thursday, regarding a restraining order placed on him by his mother.  He talks about his "baby mama and my kids" not having enough money to sustain them and being dependent on him, and states that a door-to-door transfer to a rehab facility with not work for him. He shares that he has to go "line up my ducks and get things in check and situated for my baby mama and my kids".  Patient is unmotivated to go to rehab at this time, currently does not present a danger to self or others, has been calm and cooperative for the entire duration of his stay. He reports not being ready to commit to rehab, but reports motivation to stop using alcohol & THC by staying away from the people, places and things that make him use. He is not interested in AA/NA at this time. He states that he would like to be discharged, to spend time with his children prior to going to rehab.  Resources for rehab facilities have been placed into patient's after discharge summary, he has been educated on this, verbalizes understanding.  Suicide Risk Assessment: Minimal: No identifiable suicidal ideation.  Patients presenting with no risk factors but with morbid ruminations; may be classified as minimal risk based on the severity of the depressive symptoms.   Labs were reviewed with the patient, and abnormal results were discussed with the patient. CBC on 05/13 was 18.2, educated patient on the need to f/u with his PCP regarding rechecking this. He is currently asymptomatic.  The patient is able to verbalize their individual safety plan to this provider.  # It is recommended to the patient to continue psychiatric medications as prescribed, after discharge  from the hospital.    # It is recommended to the patient to follow up with your outpatient psychiatric provider and PCP.  # It was discussed with the patient, the impact of alcohol, drugs, tobacco have been there overall psychiatric and medical wellbeing, and total abstinence from  substance use was recommended the patient.ed.  # Prescriptions for 30 days worth of medications sent directly to preferred pharmacy at discharge. Patient agreeable to plan. Given opportunity to ask questions. Appears to feel comfortable with discharge.    # In the event of worsening symptoms, the patient is instructed to call the crisis hotline 988, 911, come back to this facility, and or go to the nearest ED for appropriate evaluation and treatment of symptoms. To follow-up with primary care provider for other medical issues, concerns and or health care needs  # Patient was discharged with a plan to follow up as noted below & also using resources provided to call for a possible rehab treatment.    Total Time spent with patient: 45 minutes  Tobacco Cessation:  N/A, patient does not currently use tobacco products  Current Medications:  Current Facility-Administered Medications  Medication Dose Route Frequency Provider Last Rate Last Admin   acetaminophen  (TYLENOL ) tablet 650 mg  650 mg Oral Q6H PRN Atira Borello, NP       acetaminophen  (TYLENOL ) tablet 650 mg  650 mg Oral Q6H PRN Brittyn Salaz, NP       alum & mag hydroxide-simeth (MAALOX/MYLANTA) 200-200-20 MG/5ML suspension 30 mL  30 mL Oral Q4H PRN Aryana Wonnacott, NP       haloperidol  (HALDOL ) tablet 5 mg  5 mg Oral TID PRN Robet Chiquito, NP       And   diphenhydrAMINE  (BENADRYL ) capsule 50 mg  50 mg Oral TID PRN Robet Chiquito, NP       haloperidol  (HALDOL ) tablet 5 mg  5 mg Oral TID PRN Robet Chiquito, NP       And   diphenhydrAMINE  (BENADRYL ) capsule 50 mg  50 mg Oral TID PRN Robet Chiquito, NP       haloperidol  lactate (HALDOL ) injection 10 mg  10 mg Intramuscular TID PRN Robet Chiquito, NP       And   diphenhydrAMINE  (BENADRYL ) injection 50 mg  50 mg Intramuscular TID PRN Robet Chiquito, NP       And   LORazepam  (ATIVAN ) injection 2 mg  2 mg Intramuscular TID PRN Robet Chiquito, NP       haloperidol  lactate (HALDOL ) injection 5  mg  5 mg Intramuscular TID PRN Robet Chiquito, NP       And   diphenhydrAMINE  (BENADRYL ) injection 50 mg  50 mg Intramuscular TID PRN Robet Chiquito, NP       And   LORazepam  (ATIVAN ) injection 2 mg  2 mg Intramuscular TID PRN Robet Chiquito, NP       haloperidol  lactate (HALDOL ) injection 5 mg  5 mg Intramuscular TID PRN Robet Chiquito, NP       And   diphenhydrAMINE  (BENADRYL ) injection 50 mg  50 mg Intramuscular TID PRN Robet Chiquito, NP       And   LORazepam  (ATIVAN ) injection 2 mg  2 mg Intramuscular TID PRN Robet Chiquito, NP       haloperidol  lactate (HALDOL ) injection 10 mg  10 mg Intramuscular TID PRN Robet Chiquito, NP       And   diphenhydrAMINE  (BENADRYL ) injection 50 mg  50 mg Intramuscular TID PRN Robet Chiquito,  NP       And   LORazepam  (ATIVAN ) injection 2 mg  2 mg Intramuscular TID PRN Robet Chiquito, NP       escitalopram  (LEXAPRO ) tablet 10 mg  10 mg Oral Daily Lanaiya Lantry, NP   10 mg at 04/26/24 0932   hydrOXYzine  (ATARAX ) tablet 25 mg  25 mg Oral TID PRN Robet Chiquito, NP   25 mg at 04/23/24 2101   magnesium  hydroxide (MILK OF MAGNESIA) suspension 30 mL  30 mL Oral Daily PRN Robet Chiquito, NP       multivitamin with minerals tablet 1 tablet  1 tablet Oral Daily Robet Chiquito, NP   1 tablet at 04/26/24 0932   thiamine  (VITAMIN B1) tablet 100 mg  100 mg Oral Daily Meri Pelot, NP   100 mg at 04/26/24 0932   traZODone  (DESYREL ) tablet 100 mg  100 mg Oral QHS Robet Chiquito, NP   100 mg at 04/25/24 2257   traZODone  (DESYREL ) tablet 50 mg  50 mg Oral QHS PRN Robet Chiquito, NP       Current Outpatient Medications  Medication Sig Dispense Refill   [START ON 04/27/2024] escitalopram  (LEXAPRO ) 10 MG tablet Take 1 tablet (10 mg total) by mouth daily. 30 tablet 0   hydrOXYzine  (ATARAX ) 25 MG tablet Take 1 tablet (25 mg total) by mouth 3 (three) times daily as needed for anxiety. 30 tablet 0   [START ON 04/27/2024] Multiple Vitamin (MULTIVITAMIN WITH MINERALS) TABS tablet  Take 1 tablet by mouth daily.     [START ON 04/27/2024] thiamine  (VITAMIN B-1) 100 MG tablet Take 1 tablet (100 mg total) by mouth daily.     traZODone  (DESYREL ) 100 MG tablet Take 1 tablet (100 mg total) by mouth at bedtime. 30 tablet 0    PTA Medications:  Facility Ordered Medications  Medication   acetaminophen  (TYLENOL ) tablet 650 mg   alum & mag hydroxide-simeth (MAALOX/MYLANTA) 200-200-20 MG/5ML suspension 30 mL   escitalopram  (LEXAPRO ) tablet 10 mg   haloperidol  (HALDOL ) tablet 5 mg   And   diphenhydrAMINE  (BENADRYL ) capsule 50 mg   haloperidol  lactate (HALDOL ) injection 10 mg   And   diphenhydrAMINE  (BENADRYL ) injection 50 mg   And   LORazepam  (ATIVAN ) injection 2 mg   haloperidol  lactate (HALDOL ) injection 5 mg   And   diphenhydrAMINE  (BENADRYL ) injection 50 mg   And   LORazepam  (ATIVAN ) injection 2 mg   hydrOXYzine  (ATARAX ) tablet 25 mg   [EXPIRED] loperamide  (IMODIUM ) capsule 2-4 mg   [COMPLETED] LORazepam  (ATIVAN ) tablet 1 mg   Followed by   [COMPLETED] LORazepam  (ATIVAN ) tablet 1 mg   Followed by   [COMPLETED] LORazepam  (ATIVAN ) tablet 1 mg   Followed by   [COMPLETED] LORazepam  (ATIVAN ) tablet 1 mg   magnesium  hydroxide (MILK OF MAGNESIA) suspension 30 mL   multivitamin with minerals tablet 1 tablet   [EXPIRED] ondansetron  (ZOFRAN -ODT) disintegrating tablet 4 mg   thiamine  (VITAMIN B1) tablet 100 mg   traZODone  (DESYREL ) tablet 50 mg   acetaminophen  (TYLENOL ) tablet 650 mg   haloperidol  (HALDOL ) tablet 5 mg   And   diphenhydrAMINE  (BENADRYL ) capsule 50 mg   haloperidol  lactate (HALDOL ) injection 5 mg   And   diphenhydrAMINE  (BENADRYL ) injection 50 mg   And   LORazepam  (ATIVAN ) injection 2 mg   haloperidol  lactate (HALDOL ) injection 10 mg   And   diphenhydrAMINE  (BENADRYL ) injection 50 mg   And   LORazepam  (ATIVAN ) injection 2 mg   traZODone  (DESYREL )  tablet 100 mg   PTA Medications  Medication Sig   [START ON 04/27/2024] escitalopram  (LEXAPRO ) 10 MG  tablet Take 1 tablet (10 mg total) by mouth daily.   hydrOXYzine  (ATARAX ) 25 MG tablet Take 1 tablet (25 mg total) by mouth 3 (three) times daily as needed for anxiety.   traZODone  (DESYREL ) 100 MG tablet Take 1 tablet (100 mg total) by mouth at bedtime.   [START ON 04/27/2024] Multiple Vitamin (MULTIVITAMIN WITH MINERALS) TABS tablet Take 1 tablet by mouth daily.   [START ON 04/27/2024] thiamine  (VITAMIN B-1) 100 MG tablet Take 1 tablet (100 mg total) by mouth daily.       04/24/2024   11:45 AM 07/07/2018   12:21 AM  Depression screen PHQ 2/9  Decreased Interest 0 0  Down, Depressed, Hopeless 1 0  PHQ - 2 Score 1 0    Flowsheet Row ED from 04/21/2024 in [facility] ED from 04/20/2024 in [facility] UC from 09/04/2023 in [facility])  C-SSRS RISK CATEGORY No Risk No Risk No Risk       Musculoskeletal  Strength & Muscle Tone: within normal limits Gait & Station: normal Patient leans: N/A  Psychiatric Specialty Exam  Presentation  General Appearance:  Fairly Groomed  Eye Contact: Fair  Speech: Clear and Coherent  Speech Volume: Normal  Handedness: Right   Mood and Affect  Mood: Depressed  Affect: Congruent   Thought Process  Thought Processes: Coherent  Descriptions of Associations:Intact  Orientation:Full (Time, Place and Person)  Thought Content:Logical  Diagnosis of Schizophrenia or Schizoaffective disorder in past: No    Hallucinations:Hallucinations: None  Ideas of Reference:None  Suicidal Thoughts:Suicidal Thoughts: No  Homicidal Thoughts:Homicidal Thoughts: No   Sensorium  Memory: Immediate Good  Judgment: Fair  Insight: Fair   Art therapist  Concentration: Good  Attention Span: Good  Recall: Fair  Fund of Knowledge: Fair  Language: Good   Psychomotor Activity  Psychomotor Activity: Psychomotor Activity:  Normal   Assets  Assets: Resilience; Social Support   Sleep  Sleep: Sleep: Good   No data recorded  Physical Exam  Physical Exam Constitutional:      Appearance: Normal appearance.  Musculoskeletal:     Cervical back: Normal range of motion.  Neurological:     General: No focal deficit present.     Mental Status: He is alert and oriented to person, place, and time.  Psychiatric:        Mood and Affect: Mood normal.        Behavior: Behavior normal.        Thought Content: Thought content normal.        Judgment: Judgment normal.    Review of Systems  Psychiatric/Behavioral:  Positive for depression (Denies SI/HI, denies intent or plan to harm self or others) and substance abuse (Reports motivation to stop using). Negative for hallucinations and suicidal ideas. The patient is nervous/anxious (stable for management outpatient) and has insomnia (stable for management outpatient).   All other systems reviewed and are negative.  Blood pressure 118/78, pulse (!) 101, temperature 98.1 F (36.7 C), temperature source Oral, resp. rate 16, SpO2 99%. There is no height or weight on file to calculate BMI.  Demographic Factors:  Male and Caucasian  Loss Factors: Legal issues  Historical Factors: Family history of mental illness or substance abuse  Risk Reduction Factors:   Responsible for children under 48 years of age  Continued  Clinical Symptoms:  Alcohol/Substance Abuse/Dependencies Unstable or Poor Therapeutic Relationship Previous Psychiatric Diagnoses and Treatments  Cognitive Features That Contribute To Risk:  None     Plan Of Care/Follow-up recommendations:  Other:  See resources which have been attached to discharge instructions.  Follow up with [facility] Only  Walk-in hours for open access (medication management and therapy) are Monday - Friday 8 am to 11 am. Appointments are limited, so please  arrive at 7:00 am. Upon arrival, please complete the form on the clipboard located at the front desk. If there are no clipboards available, all appointments have been filled for that day.  [facility] [address] [place]   [address] [phone]   Disposition: Discharged home with plan to call using the resources provided on his AVS to navigate searching for his own rehab placement.  Robet Chiquito, NP 04/26/2024, 12:59 PM

## 2024-04-26 NOTE — ED Notes (Signed)
 Pt has been calm and cooperative.  Brighter affect and behaviors appropriate.  Pt decided  he would rather pursue out patient treatment.

## 2024-04-26 NOTE — ED Notes (Signed)
 Patient discharged home per MD order. After Visit Summary (AVS) printed and given to patient, as well as printed prescriptions. AVS reviewed with patient and all questions fully answered. Patient discharged in no acute distress, A& O x4 and ambulatory. Patient denied SI/HI, A/VH upon discharge. Patient verbalized understanding of all discharge instructions explained by staff, including follow up appointments, RX's and safety plan. Patient mood good. Patient belongings returned to patient from locker #10 complete and intact. Patient escorted to lobby via staff for transport to destination. Safety maintained.

## 2024-04-26 NOTE — Discharge Instructions (Addendum)
 Non-Emergent / Urgent   [facility] [address], SECOND FLOOR [place], [place] [address] [phone] OUTPATIENT Walk-in information: Please note, all walk-ins are first come & first serve, with limited number of availability.   Please note that to be eligible for services you must bring: ID or a piece of mail with your name [facility] address   Therapist for therapy:  Monday & Wednesdays: Please ARRIVE at 7:15 AM for registration Will START at 8:00 AM Every 1st & 2nd Friday of the month: Please ARRIVE at 10:15 AM for registration Will START at 1 PM - 5 PM   Psychiatrist for medication management: Monday - Friday:  Please ARRIVE at 7:15 AM for registration Will START at 8:00 AM   Regretfully, due to limited availability, please be aware that you may not been seen on the same day as walk-in. Please consider making an appoint or try again. Thank you for your patience and understanding. ________________________________________________________   [facility] URGENT CARE:  [address] FIRST FLOOR.  [place], [place] [address].  [phone]   Mobile Crisis Response Teams Listed by counties in vicinity of [facility] providers [facility]  [facility] [phone] [facility]  [facility] [phone] [facility]  [facility] [phone] [facility]  [facility] [phone] [place]                 * [facility] [phone]                * [facility] [phone] [facility]  [facility] [phone] [facility]  [phone] * [employer] [phone] ________________________________________________________  LCSW spoke with patient regarding plans at discharge. Patient aware of resources provided at the [facility]-Outpatient New Patient Assessment/Therapy Walk ins:. Instructions provided in AVS. Patient expressed appreciation for LCSW assistance with discharge planning. Patient reports feeling safe to discharge and reports having support from family when he/she returns home. No other needs were reported at this time. LCSW to sign off. Please inform if further LCSW needs arise prior to discharge.   [email or web address] [address], [place], [place] [address] [phone] Open 24 hours

## 2024-05-06 ENCOUNTER — Encounter: Payer: 59 | Admitting: Nurse Practitioner
